# Patient Record
Sex: Female | Born: 1985 | Hispanic: Yes | Marital: Single | Smoking: Never smoker
Health system: Southern US, Community
[De-identification: ages and names within clinical notes are randomized; demographics above are authoritative.]

## PROBLEM LIST (undated history)

## (undated) ENCOUNTER — Inpatient Hospital Stay (HOSPITAL_COMMUNITY): Payer: Self-pay

## (undated) DIAGNOSIS — B999 Unspecified infectious disease: Secondary | ICD-10-CM

## (undated) HISTORY — PX: DILATION AND CURETTAGE OF UTERUS: SHX78

---

## 2013-10-08 ENCOUNTER — Ambulatory Visit: Payer: Self-pay | Admitting: Internal Medicine

## 2013-10-08 VITALS — BP 112/68 | HR 86 | Temp 98.1°F | Resp 16 | Ht 62.0 in | Wt 189.0 lb

## 2013-10-08 DIAGNOSIS — R21 Rash and other nonspecific skin eruption: Secondary | ICD-10-CM

## 2013-10-08 DIAGNOSIS — L42 Pityriasis rosea: Secondary | ICD-10-CM

## 2013-10-08 LAB — POCT CBC
Granulocyte percent: 63.4 %G (ref 37–80)
HCT, POC: 43.6 % (ref 37.7–47.9)
HEMOGLOBIN: 13.9 g/dL (ref 12.2–16.2)
Lymph, poc: 2 (ref 0.6–3.4)
MCH, POC: 29.6 pg (ref 27–31.2)
MCHC: 31.9 g/dL (ref 31.8–35.4)
MCV: 93 fL (ref 80–97)
MID (cbc): 0.4 (ref 0–0.9)
MPV: 9.5 fL (ref 0–99.8)
POC GRANULOCYTE: 4.1 (ref 2–6.9)
POC LYMPH PERCENT: 30.1 %L (ref 10–50)
POC MID %: 6.5 %M (ref 0–12)
Platelet Count, POC: 301 10*3/uL (ref 142–424)
RBC: 4.69 M/uL (ref 4.04–5.48)
RDW, POC: 13.9 %
WBC: 6.5 10*3/uL (ref 4.6–10.2)

## 2013-10-08 MED ORDER — CETIRIZINE HCL 10 MG PO TABS: 10.0000 mg | ORAL_TABLET | Freq: Every day | ORAL | Status: DC

## 2013-10-08 NOTE — Patient Instructions (Signed)

## 2013-10-08 NOTE — Progress Notes (Signed)
   Subjective:    Patient ID: Carla Sutton, female    DOB: 10/03/1985, 28 y.o.   MRN: [id number]  HPI Toniann FailWendy presents to the clinic today a rash all over. It started two days ago on her lower back. She states her palms became swollen at that time as well. She hasn't tried to treat is as of yet. She didn't eat any new foods or try any new detergents. She states the only thing she did different was color her hair. There is no rash in her scalp or on her face. She hasn't noticed any warmth to the rash. She notices that during bed time she itches. There is no one else in the home with a rash. She has recently gotten a new puppy in the past couple of weeks.   No fever, body aches, fatigue. No std exposure hx.  Review of Systems healthy    Objective:   Physical Exam  Vitals reviewed. Constitutional: She is oriented to person, place, and time. She appears well-developed and well-nourished. No distress.  HENT:  Head: Normocephalic.  Mouth/Throat: Oropharynx is clear and moist.  Eyes: EOM are normal. Pupils are equal, round, and reactive to light. No scleral icterus.  Neck: Normal range of motion. Neck supple.  Cardiovascular: Normal rate and normal heart sounds.   Pulmonary/Chest: Effort normal and breath sounds normal.  Abdominal: Soft.  Musculoskeletal: Normal range of motion.  Lymphadenopathy:    She has no cervical adenopathy.  Neurological: She is alert and oriented to person, place, and time. She exhibits normal muscle tone. Coordination normal.  Skin: Rash noted. No petechiae noted. Rash is maculopapular. Rash is not nodular and not vesicular. There is erythema.  Scattered trunk to thighs, faint erythema papules  Possible herald patch right lower trunk  Psychiatric: She has a normal mood and affect. Her behavior is normal.   No edema of palms or soles and no palm or sole rash  Results for orders placed in visit on 10/08/13  POCT CBC      Result Value Ref Range   WBC 6.5  4.6 - 10.2  K/uL   Lymph, poc 2.0  0.6 - 3.4   POC LYMPH PERCENT 30.1  10 - 50 %L   MID (cbc) 0.4  0 - 0.9   POC MID % 6.5  0 - 12 %M   POC Granulocyte 4.1  2 - 6.9   Granulocyte percent 63.4  37 - 80 %G   RBC 4.69  4.04 - 5.48 M/uL   Hemoglobin 13.9  12.2 - 16.2 g/dL   HCT, POC 40.943.6  81.137.7 - 47.9 %   MCV 93.0  80 - 97 fL   MCH, POC 29.6  27 - 31.2 pg   MCHC 31.9  31.8 - 35.4 g/dL   RDW, POC 91.413.9     Platelet Count, POC 301  142 - 424 K/uL   MPV 9.5  0 - 99.8 fL   rpr,hiv     Assessment & Plan:  Pityriasis Rosea likely Zyrtec 10mg  qd

## 2013-10-09 LAB — RPR

## 2013-10-09 LAB — HIV ANTIBODY (ROUTINE TESTING W REFLEX): HIV: NONREACTIVE

## 2013-10-10 ENCOUNTER — Encounter: Payer: Self-pay | Admitting: *Deleted

## 2013-12-24 ENCOUNTER — Encounter (HOSPITAL_COMMUNITY): Payer: Self-pay | Admitting: Emergency Medicine

## 2013-12-24 ENCOUNTER — Emergency Department (HOSPITAL_COMMUNITY)
Admission: EM | Admit: 2013-12-24 | Discharge: 2013-12-24 | Disposition: A | Discharge status: Home / Self Care | Payer: Medicaid Other | Attending: Emergency Medicine | Admitting: Emergency Medicine

## 2013-12-24 DIAGNOSIS — Z8759 Personal history of other complications of pregnancy, childbirth and the puerperium: Secondary | ICD-10-CM

## 2013-12-24 DIAGNOSIS — Z3202 Encounter for pregnancy test, result negative: Secondary | ICD-10-CM | POA: Insufficient documentation

## 2013-12-24 DIAGNOSIS — O09299 Supervision of pregnancy with other poor reproductive or obstetric history, unspecified trimester: Secondary | ICD-10-CM | POA: Insufficient documentation

## 2013-12-24 DIAGNOSIS — N926 Irregular menstruation, unspecified: Secondary | ICD-10-CM | POA: Insufficient documentation

## 2013-12-24 LAB — HCG, SERUM, QUALITATIVE: PREG SERUM: NEGATIVE

## 2013-12-24 NOTE — Discharge Instructions (Signed)
Do not hesitate to return to the Emergency Department for any new, worsening or concerning symptoms.   If you do not have a primary care doctor you can establish one at the   [facility]: [address] [place] [id number] [phone]  After you establish care. Let them know you were seen in the emergency room. They must obtain records for further management.    Pruebas de Psychiatristembarazo  (Pregnancy Tests) CMO FUNCIONAN LAS PRUEBAS DE [facility]?  Todas las pruebas de Psychiatristembarazo buscan una hormona especial en la orina o en la sangre que slo est presente en las mujeres embarazadas. Esta hormona, la gonadotropina corinica humana (hCG), tambin se conoce como la hormona del [place]embarazo.  CUL ES LA DIFERENCIA ENTRE UNA PRUEBA DE EMBARAZO DE ORINA Y UNA PRUEBA EMBARAZO EN SANGRE? ES UNA MEJOR QUE LA OTRA?  Hay dos tipos de pruebas de [place].   Pruebas de [place].  Pruebas de [place]. En ambas se busca la presencia de hCG, la hormona del embarazo. Muchas mujeres se hacen una prueba de [place] o una prueba casera de embarazo (HPT) para averiguar si estn embarazadas. Las HPT son econmicas, fciles de usar, se pueden hacer en casa y son privadas. Cuando una mujer tiene un resultado positivo en una HPT, debe ver a su mdico inmediatamente. El mdico confirmar el resultado positivo de la HPT con otro anlisis de [place], anlisis de [place]angre, [place]una ecografa y un examen plvico.  Hay dos tipos de anlisis de sangre que el mdico puede indicar.   Un anlisis de sangre cuantitativo (o prueba de hCG beta). Esta prueba mide la cantidad exacta de hCG en la sangre Esto significa que puede detectar cantidades muy pequeas de hCG, por lo que es una prueba muy precisa.  Anlisis de sangre hCG cualitativo. Esta prueba slo confirma o no el embarazo. Se asemeja ms a la prueba de [place] en cuanto a su exactitud. Los ARAMARK Corporationanlisis de sangre pueden Engineer, manufacturingdetectar la hCG antes que las pruebas de [place]. Los ARAMARK Corporationanlisis  de sangre pueden Engineer, manufacturingdetectar un Psychiatristembarazo de unos 6 a 8 das despus de liberar un vulo del ovario (ovular). Los anlisis de [place] pueden Landdetectar un embarazo alrededor de 2 semanas despus de la ovulacin. Algunas pruebas de orina ms sensibles pueden decir si usted est embarazada hasta 6 das o an hasta 1 da despus de la falta del perodo menstrual. CMO SE HACE UNA PRUEBA DE EMBARAZO EN CASA?  Hay muchos tipos de pruebas de embarazo caseras o HPT que se pueden comprar sin receta en las farmacias.   Algunas consisten en recolectar la orina en un recipiente y sumergir una tira reactiva en la orina y otros en poner un poco de orina en un recipiente especial con un gotero.  En otros casos se coloca una tira reactiva en el flujo de orina.  Las pruebas varan segn el tiempo de espera en que la tira reactiva o el contenedor tomen un cierto color o aparezca un smbolo en ella (como un smbolo ms o un smbolo menos).  Todos los envases vienen con instrucciones escritas. La mayora de los envases tambin traen nmeros de telfono gratuitos para Freight forwarderllamar en caso de duda sobre cmo hacer la prueba o leer los [place]per[place]resultados. QU PRECISIN TIENEN LAS PRUEBAS DE EMBARAZO CASERAS?  Las HPT son muy precisas. La mayora de las marcas de HPTs afirman que tienen entre el 97% y 99% de precisin cuando se realizan 1 semana despus de la falta del perodo menstrual,  pero esto puede variar con el uso real. Cada marca vara en el grado de sensibilidad para detectar la hormona del embarazo hCG. Si la prueba no se realiza correctamente, ser [employer]. Controle siempre el envase para asegurarse de que no ha pasado la fecha de vencimiento. En este caso no ser segura. La mayora de las marcas aconsejan volver a Radio producer la prueba despus de [address], no importa cules sean los [place]ese.  Si hace la prueba demasiado temprano en el embarazo, puede ocurrir que no haya suficiente cantidad de hormona del embarazo hCG en la  orina para obtener un resultado positivo. La mayora de HPTs sern precisos si realiza la prueba en la poca de la primera falta (aproximadamente 2 semanas despus de la ovulacin). Si no est embarazada, o si ha ovulado antes de lo que supona, obtendr un resultado negativo. Tambin puede haber problemas en el embarazo que afecten la cantidad de hCG que hay en la orina. Si el HPT es negativo, repita la prueba en unos das o en [place]. Si an el resultado es negativo y piensa que est embarazada, hable con su mdico inmediatamente acerca de cmo realizar una prueba de Retail banker.  PRUEBA DE EMBARAZO FALSO POSITIVA  Una HPT falso positiva ocurre cuando hay sangre o protenas en la orina. Puede haber falso positivo tambin si estuvo embarazada recientemente o si realiza la prueba de embarazo antes de [place], despus de haber utilizado un medicamento para la fertilidad que contenga hCG. Adems, algunos medicamentos recetados, como los diurticos, tranquilizantes, medicamentos psiquitricos, para las convulsiones, la [place], las nuseas (prometazina) dan falsos positivos.  PRUEBA DE EMBARAZO FALSO NEGATIVA   Una HPT falso negativa puede ocurrir si realiza la prueba [employer]. Trate de esperar hasta que tenga al menos 1 da de retraso de su periodo menstrual.  Tambin puede ocurrir si espera demasiado tiempo para utilizar la orina ( ms de 15 minutos).  Tambin si la orina est demasiado diluida por haber bebido mucho lquido antes de obtener la [place]. Lo mejor es [employer] la primera orina de la maana despus de levantarse de la cama. Si su periodo menstrual no se inici despus de una semana de un HPT negativa, repita la prueba de embarazo.  CUALQUIER CIRCUNSTANCIA PUEDE INTERFERIR CON LOS RESULTADOS DEL TEST DE EMBARAZO?  La [employer] de los medicamentos de venta libre como los recetados, incluyendo las pldoras anticonceptivas y los antibiticos, no Geologist, engineering los  resultados de [place] HPT. Slo los medicamentos que contienen hormona del embarazo hCG pueden dar un resultado falso positivo. Los medicamentos que contienen hCG se pueden usar para el tratamiento de la infertilidad (imposibilidad para quedar embarazada). El alcohol y las drogas no afectan los Post del HPT, pero no debera usar estas sustancias si est tratando de quedar embarazada. Si usted tiene una prueba de [place] positiva, llame a su mdico para hacer una cita y comenzar el cuidado prenatal. Document Released: 06/20/2012 [facility] Patient Information 2014 [place], [place].

## 2013-12-24 NOTE — ED Notes (Signed)
Pt in requesting a blood draw for a pregnancy test, states she took some tests at home and some were positive and some were negative so she wanted to be sure. Denies complaints or pain.

## 2013-12-24 NOTE — ED Notes (Signed)
Declined W/C at D/C and was escorted to lobby by RN. 

## 2013-12-24 NOTE — ED Provider Notes (Signed)
CSN: [id number]     Arrival date & time 12/24/13  1648 History   None   This chart was scribed for non-physician practitioner, Wynetta EmeryNicole Esmond Hinch  PA-C working with Audree CamelScott T Goldston, MD by Arlan OrganAshley Leger, ED Scribe. This patient was seen in room TR07C/TR07C and the patient's care was started at 7:12 PM.   Chief Complaint  Patient presents with  . Possible Pregnancy  \  The history is provided by the patient. No language interpreter was used.    HPI Comments: Carla Sutton is a 28 y.o. female who presents to the Emergency Department for possible pregnancy today. Pt states she took a few at home pregnancy tests with both negative and positive results. She states she has a history of miscarriages and wanted to take extra precaution today. LNMP 4/30. She denies any nausea, vomiting, diarrhea, or abdominal pain. She is currently not followed by an OB/GYN. She has no other pertinent past medical history. No other concerns this visit.  History reviewed. No pertinent past medical history. History reviewed. No pertinent past surgical history. Family History  Problem Relation Age of Onset  . Cancer Mother   . Diabetes Mother   . Heart disease Mother   . Hyperlipidemia Mother   . Hypertension Mother   . Hyperlipidemia Father   . Hypertension Father   . Diabetes Father    History  Substance Use Topics  . Smoking status: Never Smoker   . Smokeless tobacco: Not on file  . Alcohol Use: 2.5 oz/week    5 drink(s) per week   OB History   Grav Para Term Preterm Abortions TAB SAB Ect Mult Living                 Review of Systems  Constitutional: Negative for fever and chills.  HENT: Negative for congestion.   Eyes: Negative for redness.  Respiratory: Negative for cough.   Gastrointestinal: Negative for nausea, vomiting and abdominal pain.  Skin: Negative for rash.  Psychiatric/Behavioral: Negative for confusion.      Allergies  Review of patient's allergies indicates no known  allergies.  Home Medications   Prior to Admission medications   Not on File   Triage Vitals: BP 126/90  Pulse 107  Temp(Src) 98.5 F (36.9 C) (Oral)  SpO2 100%  LMP 11/14/2013   Physical Exam  Nursing note and vitals reviewed. Constitutional: She is oriented to person, place, and time. She appears well-developed and well-nourished. No distress.  HENT:  Head: Normocephalic.  Eyes: Conjunctivae and EOM are normal.  Cardiovascular: Normal rate.   Pulmonary/Chest: Effort normal. No stridor.  Musculoskeletal: Normal range of motion.  Neurological: She is alert and oriented to person, place, and time.  Psychiatric: She has a normal mood and affect.    ED Course  Procedures (including critical care time)  DIAGNOSTIC STUDIES: Oxygen Saturation is 100% on RA, Normal by my interpretation.    COORDINATION OF CARE: 7:14 PM- Advised pt to establish with an OB/GYN. Discussed treatment plan with pt at bedside and pt agreed to plan.     Labs Review Labs Reviewed  HCG, SERUM, QUALITATIVE    Imaging Review No results found.   EKG Interpretation None      MDM   Final diagnoses:  Negative pregnancy test  Irregular menstrual bleeding  History of miscarriage, not currently pregnant    Filed Vitals:   12/24/13 1703 12/24/13 1918  BP: 126/90 102/73  Pulse: 107 90  Temp: 98.5 F (36.9 C) 98.5  F (36.9 C)  TempSrc: Oral Oral  Resp:  18  SpO2: 100% 98%    Medications - No data to display  Carla Sutton is a 28 y.o. female presenting with possible pregnancy, serum pregnancy test is negative. Advised to follow with OB/GYN at [facility].  Evaluation does not show pathology that would require ongoing emergent intervention or inpatient treatment. Pt is hemodynamically stable and mentating appropriately. Discussed findings and plan with patient/guardian, who agrees with care plan. All questions answered. Return precautions discussed and outpatient follow up given.    I  personally performed the services described in this documentation, which was scribed in my presence. The recorded information has been reviewed and is accurate.    Wynetta Emery, PA-C 12/24/13 2035

## 2013-12-25 NOTE — ED Provider Notes (Signed)
Medical screening examination/treatment/procedure(s) were performed by non-physician practitioner and as supervising physician I was immediately available for consultation/collaboration.   EKG Interpretation None        Josie Mesa T Admiral Marcucci, MD 12/25/13 1640 

## 2014-01-09 ENCOUNTER — Encounter (HOSPITAL_COMMUNITY): Payer: Self-pay | Admitting: Emergency Medicine

## 2014-01-09 ENCOUNTER — Emergency Department (HOSPITAL_COMMUNITY)
Admission: EM | Admit: 2014-01-09 | Discharge: 2014-01-10 | Disposition: A | Discharge status: Home / Self Care | Payer: Medicaid Other | Attending: Emergency Medicine | Admitting: Emergency Medicine

## 2014-01-09 DIAGNOSIS — Z3490 Encounter for supervision of normal pregnancy, unspecified, unspecified trimester: Secondary | ICD-10-CM

## 2014-01-09 DIAGNOSIS — N760 Acute vaginitis: Secondary | ICD-10-CM | POA: Insufficient documentation

## 2014-01-09 DIAGNOSIS — O2390 Unspecified genitourinary tract infection in pregnancy, unspecified trimester: Secondary | ICD-10-CM | POA: Insufficient documentation

## 2014-01-09 DIAGNOSIS — B9689 Other specified bacterial agents as the cause of diseases classified elsewhere: Secondary | ICD-10-CM | POA: Insufficient documentation

## 2014-01-09 DIAGNOSIS — A499 Bacterial infection, unspecified: Secondary | ICD-10-CM | POA: Insufficient documentation

## 2014-01-09 LAB — CBC
HCT: 39.2 % (ref 36.0–46.0)
HEMOGLOBIN: 13.2 g/dL (ref 12.0–15.0)
MCH: 29.9 pg (ref 26.0–34.0)
MCHC: 33.7 g/dL (ref 30.0–36.0)
MCV: 88.9 fL (ref 78.0–100.0)
Platelets: 277 10*3/uL (ref 150–400)
RBC: 4.41 MIL/uL (ref 3.87–5.11)
RDW: 12.8 % (ref 11.5–15.5)
WBC: 10 10*3/uL (ref 4.0–10.5)

## 2014-01-09 LAB — PREGNANCY, URINE: Preg Test, Ur: POSITIVE — AB

## 2014-01-09 LAB — HCG, QUANTITATIVE, PREGNANCY: hCG, Beta Chain, Quant, S: 3217 m[IU]/mL — ABNORMAL HIGH (ref <5)

## 2014-01-09 NOTE — ED Notes (Signed)
Patient states that she is cramping and her back is hurting

## 2014-01-09 NOTE — ED Provider Notes (Signed)
CSN: [id number]     Arrival date & time 01/09/14  1952 History   First MD Initiated Contact with Patient 01/09/14 2301     Chief Complaint  Patient presents with  . Possible Pregnancy     (Consider location/radiation/quality/duration/timing/severity/associated sxs/prior Treatment) HPI 28 yo female presents to the ER from home with complaint of possible pregnancy.  Pt was seen in the ED on 6/9 after equivocal home pregnancy test, and had negative serum pregnancy test.  Since that time she has had increased breast tenderness, back pain.  She was told by planned pregnancy that she had a positive pregnancy test.  Last normal menstrual cycle was 4/30, but had one day of bleeding around 5/25.  Pt is sexually active, no birth control used.  Pt is not taking prenatal vitamins.  Pt reports h/o miscarrage at around 6-9 weeks requiring d&c.  Pt is very concerned she may have another miscarriage and wishes to find out ways to prevent this from happening.  She denies any vaginal bleeding, no pelvic pain.   History reviewed. No pertinent past medical history. Past Surgical History  Procedure Laterality Date  . Dilation and curettage of uterus     Family History  Problem Relation Age of Onset  . Cancer Mother   . Diabetes Mother   . Heart disease Mother   . Hyperlipidemia Mother   . Hypertension Mother   . Hyperlipidemia Father   . Hypertension Father   . Diabetes Father    History  Substance Use Topics  . Smoking status: Never Smoker   . Smokeless tobacco: Never Used  . Alcohol Use: 2.5 oz/week    5 drink(s) per week   OB History   Grav Para Term Preterm Abortions TAB SAB Ect Mult Living                 Review of Systems  All other systems reviewed and are negative.     Allergies  Review of patient's allergies indicates no known allergies.  Home Medications   Prior to Admission medications   Not on File   BP 143/85  Pulse 96  Temp(Src) 98.1 F (36.7 C) (Oral)  Resp 18  Ht  5\' 1"  (1.549 m)  Wt 194 lb 9.6 oz (88.27 kg)  BMI 36.79 kg/m2  SpO2 100%  LMP 11/14/2013 Physical Exam  Nursing note and vitals reviewed. Constitutional: She is oriented to person, place, and time. She appears well-developed and well-nourished. She appears distressed (tearful).  HENT:  Head: Normocephalic and atraumatic.  Nose: Nose normal.  Mouth/Throat: Oropharynx is clear and moist.  Eyes: Conjunctivae and EOM are normal. Pupils are equal, round, and reactive to light.  Neck: Normal range of motion. Neck supple. No JVD present. No tracheal deviation present. No thyromegaly present.  Cardiovascular: Normal rate, regular rhythm, normal heart sounds and intact distal pulses.  Exam reveals no gallop and no friction rub.   No murmur heard. Pulmonary/Chest: Effort normal and breath sounds normal. No stridor. No respiratory distress. She has no wheezes. She has no rales. She exhibits no tenderness.  Abdominal: Soft. Bowel sounds are normal. She exhibits no distension and no mass. There is no tenderness. There is no rebound and no guarding.  Genitourinary:  External genitalia normal Vagina with mild white discharge Cervix closed no lesions No cervical motion tenderness Adnexa palpated, no masses or tenderness noted Bladder palpated no tenderness Uterus palpated no masses or tenderness    Musculoskeletal: Normal range of motion. She exhibits  no edema and no tenderness.  Lymphadenopathy:    She has no cervical adenopathy.  Neurological: She is alert and oriented to person, place, and time. She exhibits normal muscle tone. Coordination normal.  Skin: Skin is warm and dry. No rash noted. No erythema. No pallor.  Psychiatric: She has a normal mood and affect. Her behavior is normal. Judgment and thought content normal.    ED Course  Procedures (including critical care time) Labs Review Labs Reviewed  WET PREP, GENITAL - Abnormal; Notable for the following:    Clue Cells Wet Prep HPF  POC MODERATE (*)    WBC, Wet Prep HPF POC MANY (*)    All other components within normal limits  HCG, QUANTITATIVE, PREGNANCY - Abnormal; Notable for the following:    hCG, Beta Chain, Quant, S 3217 (*)    All other components within normal limits  PREGNANCY, URINE - Abnormal; Notable for the following:    Preg Test, Ur POSITIVE (*)    All other components within normal limits  GC/CHLAMYDIA PROBE AMP  CBC  URINALYSIS, ROUTINE W REFLEX MICROSCOPIC    Imaging Review No results found.   EKG Interpretation None      MDM   Final diagnoses:  Pregnancy  Bacterial vaginosis    28 yo G2P1 with elevated hcg, estimated 3-5 week pregnancy.  Pt counseled on keeping her ob appt tomorrow, starting prenatal vitamins, getting book on pregnancy, and staying healthy.  Do not feel pt needs u/s at this time, given minimal back pain, and very early pregnancy.  Mild BV noted, will start on flagy   Olivia Mackielga M Otter, MD 01/10/14 1655

## 2014-01-09 NOTE — ED Notes (Signed)
Patient presents tearful and is not feeling right.  States she has been told she is not pregnant then planned partenthood tells her she is pregnant.  Has had a miscarriage in the past and wants to make sure she is not going to have that same problem

## 2014-01-10 LAB — URINALYSIS, ROUTINE W REFLEX MICROSCOPIC
Bilirubin Urine: NEGATIVE
Glucose, UA: NEGATIVE mg/dL
HGB URINE DIPSTICK: NEGATIVE
Ketones, ur: NEGATIVE mg/dL
Leukocytes, UA: NEGATIVE
Nitrite: NEGATIVE
Protein, ur: NEGATIVE mg/dL
SPECIFIC GRAVITY, URINE: 1.025 (ref 1.005–1.030)
Urobilinogen, UA: 0.2 mg/dL (ref 0.0–1.0)
pH: 5.5 (ref 5.0–8.0)

## 2014-01-10 LAB — WET PREP, GENITAL
Trich, Wet Prep: NONE SEEN
YEAST WET PREP: NONE SEEN

## 2014-01-10 LAB — GC/CHLAMYDIA PROBE AMP
CT Probe RNA: POSITIVE — AB
GC Probe RNA: NEGATIVE

## 2014-01-10 MED ORDER — METRONIDAZOLE 250 MG PO TABS: 250.0000 mg | ORAL_TABLET | Freq: Three times a day (TID) | ORAL | Status: DC

## 2014-01-10 MED ORDER — PRENATAL COMPLETE 14-0.4 MG PO TABS: 1.0000 | ORAL_TABLET | Freq: Every day | ORAL | Status: DC

## 2014-01-10 NOTE — Discharge Instructions (Signed)
Vaginosis bacteriana (Bacterial Vaginosis) La vaginosis bacteriana es una infeccin vaginal que perturba el equilibrio normal de las bacterias que se encuentran en la vagina. Es el resultado de un crecimiento excesivo de ciertas bacterias. Esta es la infeccin vaginal ms frecuente en mujeres en edad reproductiva. El tratamiento es importante para prevenir complicaciones, especialmente en mujeres embarazadas, dado que puede causar un parto prematuro. CAUSAS  La vaginosis bacteriana se origina por un aumento de bacterias nocivas que, generalmente, estn presentes en cantidades ms pequeas en la vagina. Varios tipos diferentes de bacterias pueden causar esta afeccin. Sin embargo, la causa de su desarrollo no se comprende totalmente. FACTORES DE RIESGO Ciertas actividades o comportamientos pueden exponerlo a un mayor riesgo de desarrollar vaginosis bacteriana, entre los que se incluyen:  Tener una nueva pareja sexual o mltiples parejas sexuales.  Las duchas vaginales  El uso del DIU (dispositivo intrauterino) como mtodo anticonceptivo. El contagio no se produce en baos, por ropas de cama, en piscinas o por contacto con objetos. SIGNOS Y SNTOMAS  Algunas mujeres que padecen vaginosis bacteriana no presentan signos ni sntomas. Los sntomas ms comunes son:  Secrecin vaginal de color grisceo.  Secrecin vaginal con olor similar al [employer], especialmente despus de Sales promotion account executive.  Picazn o sensacin de ardor en la vagina o la vulva.  Ardor o dolor al [employer]ocoPhillips. DIAGNSTICO  Su mdico analizar su historia clnica y le examinar la vagina para detectar signos de vaginosis bacteriana. Puede tomarle Lauris Poag de flujo vaginal. Su mdico examinar esta muestra con un microscopio para controlar las bacterias y clulas anormales. Tambin puede realizarse un anlisis del pH vaginal.  TRATAMIENTO  La vaginosis bacteriana puede tratarse con antibiticos, en forma de comprimidos o  de crema vaginal. Puede indicarse una segunda tanda de antibiticos si la afeccin se repite despus del tratamiento.  INSTRUCCIONES PARA EL CUIDADO EN EL HOGAR   Tome solo medicamentos de venta libre o recetados, segn las indicaciones del mdico.  Si le han recetado antibiticos, tmelos como se le indic. Asegrese de que finaliza la prescripcin completa aunque se sienta mejor.  No mantenga relaciones sexuales Librarian, academic.  Comunique a sus compaeros sexuales que sufre una infeccin vaginal. Deben consultar a su mdico y recibir tratamiento si tienen problemas, como picazn o una erupcin cutnea leve.  Practique el sexo seguro usando preservativos y tenga un nico compaero sexual. SOLICITE ATENCIN MDICA SI:   Sus sntomas no mejoran despus de 3 das de [place].  Aumenta la secrecin o Chief Technology Officer.  Tiene fiebre. ASEGRESE DE QUE:   Comprende estas instrucciones.  Controlar su afeccin.  Recibir ayuda de inmediato si no mejora o si empeora. PARA OBTENER MS INFORMACIN  Centros para el control y la prevencin de Child psychotherapist for Disease Control and Prevention, CDC): SolutionApps.co.za Asociacin Estadounidense de la Salud Sexual (American Sexual Health Association, SHA): www.ashastd.org  Document Released: 10/11/2007 Document Revised: 04/24/2013 [facility] Patient Information 2015 [place], [place]. This information is not intended to replace advice given to you by your health care provider. Make sure you discuss any questions you have with your health care provider.  Defectos del nacimiento, prevencin (Birth Defects, Prevention) Un defecto congnito es una anormalidad que el beb presenta en el momento de nacer. Slo entre el 2% y el 3% de los bebs puede tener un defecto congnito grave. Algunos pueden tratarse con medicamentos. Otros pueden tratarse con [place]. Muchos de [employer] evitarse, otros no. La muerte por defectos de  nacimiento no es comn.  Algunos pueden hallarse realizando estudios durante el [place]. Sin embargo, no son 100% certeros:  Physiological scientist prueba puede [employer] positivos para un defecto cuando el nio es normal (falso positivo).  Una prueba puede mostrar que no hay problemas cuando efectivamente hay un defecto congnito (falso negativo). Algunas de estas pruebas son:  Regulatory affairs officer.  Anlisis de Energy East Corporation.  Muestras de vellosidades corinicas.  Pruebas del lquido amnitico (amniocentesis).  Anlisis de sangre tomada del cordn umbilical del feto (cordocentesis). CAUSAS La mayora de los defectos de nacimiento tienen causas desconocidas. Sin embargo, algunos defectos se deben a causas especficas, como por ejemplo:  Los defectos genticos pueden ser originados en genes y cromosomas anormales. Ellos pueden ser:  Fibrosis qustica.  Anemia drepanoctica.  Enfermedad de Tay-Sachs.  Sndrome del x frgil.  Sndrome de Down.  Talasemia.  Enfermedades infecciosas como:  [place] measles.  Cytomegalovirus.  Toxoplasmosis.  Parovirus.  Varicela  La sfilis.  Virus del Herpes.  Diabetes no controlada antes y Academic librarian.  Convulsiones recurrentes (epilepsia).  Agentes qumicos como:  Consumo de alcohol, que puede producir discapacidad fsica y Mudlogger en el beb. Este trastorno se denomina sndrome de alcoholismo fetal (SAF).  El [place], que se encuentra en ciertos peces (tiburn, pez espada, azulejo).  Plomo, generalmente hallado en las pinturas.  El consumo de drogas.  Humo del cigarrillo.  Ciertos medicamentos prescriptos, de venta libre y las hierbas medicinales que toma. Si le han prescripto un medicamento, converse con su mdico antes de interrumpirlo.  Radiacin.  Quimioterapia.  Carencia de cido flico, una vitamin B, antes y [address]  Falta de yodo antes y Academic librarian. FACTORES DE RIESGO   Edad  de la madre de ms de 35 aos.  Edad del padre de ms de 50 aos.  Madre con historia de diabetes.  Madre con historia de epilepsia.  Historial de defectos de nacimiento en sus padres.  Otro beb con defectos de nacimiento.  Un historial familiar de defectos de nacimiento. INSTRUCCIONES PARA EL CUIDADO Y PREVENCIN DOMICILIARIA:  Consulte al mdico tan pronto como crea que est embarazada.  No beba alcohol antes o durante el embarazo.  No consuma drogas.  No fume antes (siempre que sea posible) y Academic librarian.  Antes de quedar embarazada, tome un multavitamnico, segn las instrucciones del mdico.  Tomar cido flico 0.4 mg antes y Academic librarian. Ayuda a prevenir la espina bfida y un trastorno en el que el cerebro no se desarrolla (anencefalia). Las multivitaminas suelen contener cido flico. Tambin puede hallarse en la mayora de los cereales de desayuno.  Siga las indicaciones del profesional que lo asiste acerca del consumo de yodo. El mdico podr sugerirle que tome 150 microgramos de yodo antes y Academic librarian. Las multivitaminas suelen contener yodo.  No tome ms de 5 000 unidades internacionales de vitamina A por da.  Consuma una dieta balanceada y nutritiva.  Evite el consumo de pescado que contenga mercurio (tiburn, pez espada, [place]).  Evite consumir quesos suaves antes y Academic librarian.  Consulte a su mdico antes de tomar cualquier medicamento prescripto, de venta libre o hierbas.  Si hay una historia de defectos congnitos en la madre, el padre, nios que han nacido anteriormente o miembros de la familia, podr recibir asesoramiento gentico antes de quedar embarazada, si lo desea.  Si es diabtica, asegrese que su enfermedad est controlada antes y Academic librarian.  Reciba inmunizaciones  antes de [employer]. Deber recibir las vacunas contra la rubola, las paperas, el sarampin, la varicela, la hepatitis y [place]. Las vacunas que se reciben durante el embarazo pueden daar al beb.  Si va a tener un beb con un defecto de nacimiento que necesita tratamiento, trate de dar a Financial planner hospital. Dispondrn de los medios, los mdicos, las instalaciones de cuidados intensivos y los equipos necesarios para tratar y asistir los problemas del beb. EST SEGURO QUE:   Comprende las instrucciones para el alta mdica.  Controlar su enfermedad.  Solicitar atencin mdica de inmediato segn las indicaciones. Document Released: 12/21/2007 Document Revised: 09/26/2011 [facility] Patient Information 2015 [place], [place]. This information is not intended to replace advice given to you by your health care provider. Make sure you discuss any questions you have with your health care provider.  cido flico en el embarazo (Folic Acid in Pregnancy) El cido flico es una vitamina B que ayuda a prevenir la anomala congnita del tubo neural. El tubo neural es la parte de un feto en desarrollo que se convierte en el cerebro y la mdula espinal. Cuando el tubo neural no se cierra como corresponde, el beb nace con un defecto del tubo neural. Los defectos del tubo neural incluyen espina bfida, hernia de la mdula espinal y la ausencia de una parte o de todo el cerebro (anencefalia).  Tome cido flico al menos 4semanas antes de quedar embarazada y Energy Transfer Partners primeros de [place], que es cuando se desarrolla el tubo neural. El cido flico est disponible en la mayora de las multivitaminas o como suplemento con cido flico solamente, y tambin lo contienen algunos alimentos. El consumo de la cantidad [place] de cido flico antes de la concepcin y durante el embarazo disminuye las probabilidades de Warehouse manager un beb con un defecto del tubo neural. El hecho de tomar cido flico no influir en un defecto del tubo neural en el caso de que ya se haya desarrollado. DIAGNSTICO   Si el feto tiene un defecto del  tubo neural, un anlisis de alfa fetoprotena (AFP) en la sangre o de lquido amnitico mostrar niveles altos de alfa fetoprotena. Este anlisis se hace a todas las Tax inspector.  Una ecografa puede detectar un defecto del tubo neural. LO QUE USTED PUEDE HACER:  Tome una multivitamina con al Lowe's Companies 0,31miligramos ( ) de cido flico [employer], al menos 4semanas antes de quedar embarazada y [place] las primeras 12semanas de Psychiatrist.  Si ya ha tenido un beb con un defecto del tubo neural, tome ( ) de cido flico [employer]. Tome esta cantidad desde antes de intentar quedar [place] y [address] los primeros de [place]. Si tiene trastorno convulsivo o toma medicamentos para controlar las convulsiones, infrmeselo a su obstetra. Siga tomando el cido flico, excepto si le indican lo contrario.  CIDO FLICO EN LOS ALIMENTOS Siga una dieta saludable con alimentos que contengan cido flico, la forma natural de la vitamina. Algunos de estos alimentos son:  Cereales fortificados para el desayuno.  Lentejas.  Esprragos.  [place].  Vsceras (hgado).  Frijoles negros.  Man (solo si no es Counselling psychologist).  Brcoli.  Aura Camps.  Jugo de naranja (es mejor el jugo concentrado).  Pastas y panes enriquecidos.  Cathleen Fears. CONSULTE A SU MDICO EN LOS SIGUIENTES CASOS:  Est en el primer trimestre de embarazo y tiene niveles altos de azcar en la sangre.  Est en el primer  trimestre de embarazo y tiene fiebre alta. En casi todos los casos, un feto en el que se detecta un defecto del tubo neural necesitar atencin especializada, que puede no estar disponible en todos los hospitales. Hable con su mdico sobre lo que es mejor para usted y su beb. Document Released: 04/24/2013 Document Revised: 07/09/2013 [facility] Patient Information 2015 [place], [place]. This information is not  intended to replace advice given to you by your health care provider. Make sure you discuss any questions you have with your health care provider.  Embarazo, Engineer, maintenance trimestre (Pregnancy, First Trimester) El primer trimestre son los primeros tres meses en los que el beb crece dentro suyo. Es importante seguir las indicaciones del profesional que lo asiste. CUIDADOS EN EL HOGAR  No fumar.  No beba alcohol.  Tomar slo los medicamentos que el mdico le haya indicado.  Ejercicios.  Consuma una dieta saludable. Consuma alimentos balanceados.  Puede tener relaciones sexuales si no hay problemas con el embarazo.  Ayuda para los nuseas matinales:  Coma galletitas saladas antes de levantarse por la [place].  Coma 4  5 comidas pequeas por da en vez de tres grandes.  Beba lquidos entre comidas, no durante ellas.  Concurra a la Training and development officer profesional que lo asiste de acuerdo a lo que le haya indicado.  A veces necesita vitaminas extra y hierro Academic librarian. Pregunte al mdico si las necesita. SOLICITE AYUDA DE INMEDIATO SI:  La temperatura oral.  Percibe que tiene una prdida con olor ftido que proviene de la vagina.  Observa una hemorragia que proviene de la vagina.  Siente dolor intenso en el vientre o la espalda.  Vomita sangre. Vmitos que parecen borra de caf.  Pierde ms de 1 Kg en una semana.  Gana ms de 2 Kg en una semana.  Aumenta ms de 1 Kg en una semana Y nota los tobillos, los pies o las piernas hinchados.  Siente mareos o se desmaya.  Ha estado cerca de personas con rubola. , varicela, o la quinta enfermedad.  Presenta dolor de cabeza, diarrea, dolor al orinar o le falta la respiracin. Document Released: 09/30/2008 Document Revised: 09/26/2011 [facility] Patient Information 2015 [place], [place]. This information is not intended to replace advice given to you by your health care provider. Make sure you discuss any questions you have with your  health care provider.  Cmo crece un beb durante el embarazo (How a Baby Grows During Pregnancy) Un embarazo comienza cuando el espermatozoide del hombre ingresa al vulo de la [place]. Esto ocurre en la trompa de [place] y se denomina fertilizacin. El vulo fertilizado se llama embrin, hasta que llega a las nueve semanas a partir del momento de Tree surgeon. De las nueve semanas hasta el nacimiento se denomina "feto". El vulo fertilizado baja por la trompa [place] tero y se adhiere al recubrimiento interior del tero.  La mujer embarazada es responsable del crecimiento del embrin/feto al proveer alimento y oxgeno a travs de la corriente sangunea y la placenta hacia el feto en desarrollo. El tero se [place] y crece hacia fuera del abdomen ms y ms a medida que el feto se desarrolla y crece. Un embarazo normal dura [address] con un rango de [address], o 40 semanas. El [place] se divide en tres trimestres.  Primer trimestre - Semanas 0 a 13.  Segundo trimestre - Semana 14 a 27.  Tercer trimestre - Semana 28 a 40. El da que se considera que va  a nacer su beb se denomina fecha estimada de parto (FEP). CRECIMIENTO DEL BEB MES A MES 1. Primer mes: El vulo fertilizado se adhiere a Hydrographic surveyor interior del tero, y determinadas clulas formaran la placenta y Firefighter el feto. Comienzan a [employer] Products, las piernas, el cerebro, la mdula espinal, los pulmones, y Insurance underwriter. Hacia el final del primer mes el corazn comienza a Physicist, medical. El embrin pesa menos de 1 onza (28,35 gramos) y mide  pulgada (2,54 cm). 2. Gwynneth Aliment mes: Se pueden observar los huesos, se forman el odo interno, los prpados, las manos y los pies y se Photographer. Hacia el fin de la [place], ya se estn desarrollando todos los rganos ms importantes. El feto ahora pesa menos de 1 onza [phone] gramos) y Anice Paganini pulgada (2,54 cm). 3. Tercer mes: Aparecen brotes de los dientes,  todos los rganos internos se forman, los huesos y los msculos comienzan a Designer, industrial/product, la espina dorsal puede flexionarse y la piel es transparente. Comienzan a formarse las uas de las manos y los pies, las manos se desarrollan ms rpido [employer] Corporation 10 y los brazos son ms largos que las piernas en este punto. El feto ahora pesa un poco ms que 1 onza (.75kg) y mide 3 pulgadas (8,89cm). 4. Cuarto mes: La placenta se ha formado por completo. Se han formado los rganos sexuales externos, el cuello, el odo externo, las cejas, los prpados, y las uas. El feto ya puede or, tragar, flexionar sus brazos y sus piernas, y el rin comienza a producir orina. La piel tiene un recubrimiento blanco ceroso (vernix) y un bello muy fino (lanugo). El feto ahora pesa 5 onzas (0,14kg) y mide de 6 a 7 pulgadas (16,51 cm). 5. Quinto mes: El feto se mueve ms y puede sentirse por primera vez (se denomina avivamiento), duerme y se despierta en ocasiones, puede comenzar a succionarse el dedo y las uas crecen [employer] General final del dedo. La vescula biliar ahora funciona y [place] a digerir los nutrientes, los ovarios se desarrollan en la mujer y los testculos comienzan a Publishing copy del abdomen hacia el escroto en el hombre. El feto ahora pesa  a 1 libra [phone]) y mide de 6 a 7 pulgadas (25,4cm). 6. Sexto mes: Se forman los pulmones pero el feto an no respira. Los ojos se abren, el cerebro se desarrolla ahora ms rpidamente, se pueden detectar las huellas dactilares de las manos y los pies y crece bello ms grueso. El feto ahora pesa 1 a 1 libras [phone]) y mide 12 pulgadas (30,48 cm). 7. Sptimo mes: El feto puede oir y responder a los sonidos, patea y se estira y puede percibir cambios en la luz. El feto ahora pesa 2 a 2 libras (1,13kg) y mide 14 pulgadas (35,56 cm). 8. Octavo mes: Todos los rganos y sistemas corporales estn desarrollados por completo y en funcionamiento. Los huesos se vuelven ms duros, se desarrollan las papilas  gustativas con las que puede percibir gustos dulces y [place], y el feto puede tener hipo. Las diferentes partes del cerebro se desarrollan y el crneo se mantiene blando para que crezca el cerebro. El feto ahora pesa 5 libras (2,27kg) y mide 36 pulgadas (45,75cm). 9. Noveno mes: El feto aumenta alrededor de media libra por semana, los pulmones estn completamente desarrollados, se desarrollan patrones de sueo y la cabeza se mueve hacia el fondo del tero, denominado vertex. Si en su lugar las nalgas se mueven hacia el fondo  del tero, se denomina "presentacin de nalgas". El feto ahora pesa 6 a 9 libras (2,72kg a 102,08kg) y mide 20 pulgadas (50,8 cm). Deber informarse lo ms posible acerca de su embarazo, de usted y de cmo se desarrolla el beb. Estar informada la ayuda a Freight forwarder experiencia. Tambin Radiographer, therapeutic la capacidad de sentir si algo no est bien y cundo Civil Service fast streamer. Consulte con el profesional que la asiste si tiene dudas acerca su beb o de su propio cuerpo. Document Released: 12/21/2007 Document Revised: 09/26/2011 [facility] Patient Information 2015 [place], [place]. This information is not intended to replace advice given to you by your health care provider. Make sure you discuss any questions you have with your health care provider.  Medicamentos durante el embarazo  (Medicines During Pregnancy) Durante el embarazo, hay medicamentos que son seguros y otros no lo son. Entre los United Parcel se Baxter International recetados, los de [place], las cremas tpicas que se aplican en la piel y todos los medicamentos de hierbas. Los medicamentos se clasifican en clase A, B, C, o D. Los medicamentos de clase A y B han demostrado ser seguros en el Psychiatrist. Los medicamentos de clase C tambin se consideran seguros durante el [place], pero slo deben usarse cuando son necesarios. Los medicamentos clase D no deben Animator. Pueden ser dainos para un beb.  Lo mejor es  Occupational hygienist cantidad posible de medicamentos durante el [place]. Sin embargo, algunos son necesarios para cuidar la salud del beb y Chief Strategy Officer. A veces, es ms peligroso dejar de tomar ciertos medicamentos que continuar usndolos. Este es el caso de las personas que sufren enfermedades de larga duracin (crnicas) como el asma, la diabetes o la presin arterial alta (hipertensin). Si est embarazada y sufre una enfermedad crnica, llame a su mdico de inmediato. Lleve una lista de los medicamentos que [place] y sus dosis a las visitas mdicas. Si usted est planeando quedar embarazada, programe una cita con el mdico e infrmele acerca de los medicamentos que toma.  Por ltimo, anote el nmero de telfono de Film/video editor. Podr responder preguntas sobre la clase a que pertenece un medicamento y su seguridad. No podr darle asesoramiento en cuanto a si debe o no debe tomar un medicamento.  MEDICAMENTOS SEGUROS Y NO SEGUROS  Hay una larga lista de medicamentos que se consideran seguros para Engineer, site. A continuacin se [place]. Para determinados medicamentos, consulte con su mdico.  Medicamentos para laalergia La loratadina, cetirizina y clorfeniramina son medicamentos seguros. Algunos aerosoles nasales que contienen corticoides son seguros. Consulte a su mdico acerca de las marcas especficas que son seguras.  Analgsicos El paracetamol y el paracetamol con codena son seguros. Todos los otros medicamentos anti-inflamatorios no esteroideos (AINE) no son seguros. Aqu se incluye el ibuprofeno.  Anticidos  Muchos anticidos de venta libre son seguros para tomar. Consulte a su mdico acerca de las marcas especficas que son seguras. La famotidina, ranitidina, lansoprazol estn permitidos. El omeprazol se considera seguro para tomar en el segundo trimestre.  Antibiticos  Hay varios antibiticos que Energy manager. Estos incluyen tetraciclina, quinolonas y  sulfas, pero puede haber otros. Consulte a su mdico antes de tomar cualquier antibitico.  Antihistamnicos  Consulte a su mdico acerca de las marcas especficas que son seguras.  Medicamentos para el asma  La mayora de los inhaladores con corticoides para el asma son seguros. Hable con su mdico para obtener informacin especfica.  Calcio  Los suplementos de calcio son seguros. No consuma calcio de ostras.  Medicamentos para la tos y el resfro  Es seguro que utilice productos que contienen guaifenesina o dextrometorfano. Consulte a su mdico acerca de las marcas especficas que son seguras. No es seguro tomar productos que contengan aspirina o ibuprofeno.  Medicamentos descongestivos Los productos que contienen pseudoefedrina son seguros para tomar en el segundo y Systems analyst trimestre.  Antidepresivos  Consulte a su mdico acerca de estos medicamentos.  Antidiarreicos  Es seguro tomar loperamida. Consulte a su mdico acerca de las marcas especficas que son seguras. No es seguro tomar medicamentos antidiarreicos que contengan bismuto.  Gotas oftlmicas  Las gotas oftlmicas para la alergia deben limitarse.  Hierro  Es seguro usar en el embarazo ciertos medicamentos que contienen hierro para la anemia. Ellos requieren Auto-Owners Insurance.  Medicamentos para las nauseas  Es seguro tomar doxilamina y vitamina B6 segn las indicaciones. Si fuera necesario, existen otros medicamentos con receta disponibles.  Pldoras para dormir  Es seguro tomar difenhidramina y paracetamol con difenhidramina.  Corticoides  Las cremas con hidrocortisona son seguras si se usan como se indica. Los corticoides por va oral requieren prescripcin mdica. No es Dietitian crema para las hemorroides que contengan pramoxina o fenilefrina.  Laxantes  Es seguro tomar laxantes. Evite su uso diario o prolongado.  Medicamentos para la tiroides  Es importante que siga usando el medicamento para la  tiroides. Necesita ser controlada por su mdico.  Medicamentos para uso vaginal  El mdico le recetar un medicamento si usted tiene una infeccin vaginal. Ciertos medicamentos antifngicos son seguros si sufre una infeccin de transmisin sexual (ETS). Consulte a su mdico.  Document Released: 07/04/2005 Document Revised: 03/06/2013 [facility] Patient Information 2015 [place], [place]. This information is not intended to replace advice given to you by your health care provider. Make sure you discuss any questions you have with your health care provider.  Cuidados prenatales  (Prenatal Care ) QU SON LOS CUIDADOS PRENATALES?  Los cuidados prenatales se relacionan con la atencin de la salud durante el [place], antes de que nazca el beb. Durante el [place], es muy importante que haga lo siguiente para cuidar de usted y de su beb:   Recibir cuidados prenatales de forma temprana. Si sabe que est embarazada, o cree que podra estarlo, llame a su mdico lo antes posible. Programe una visita para un examen prenatal.  Recibir cuidados prenatales con regularidad. Cumpla con el esquema de anlisis de [place] y otros estudios necesarios que le indique su mdico. No falte a las citas.  Hacer todo lo posible para que usted y su beb estn sanos durante el embarazo.  Recibir [employer] cuidados necesarios. Los cuidados prenatales deben incluir la evaluacin de las necesidades mdicas, nutricionales, [place], psicolgicas y [place] de la embarazada y su pareja. Debe hablar con su mdico sobre la historia clnica y gentica de su familia y de la familia del padre del beb.  Informar a su mdico sobre lo siguiente:  Los medicamentos recetados, de Kinder y a base de hierbas que toma.  Cualquier antecedente de abuso de sustancias, consumo de alcohol, hbito de fumar y uso de Lexicographer.  Cualquier antecedente de violencia domstica o de otro tipo.  Las vacunas que ha recibido.  Su  nutricin y su dieta.  La cantidad de ejercicio que hace.  Cualquier peligro ambiental y ocupacional al que est expuesta.  Antecedentes de infecciones de transmisin sexual, tanto suyos como de su  pareja.  Embarazos previos. POR QU SON TAN IMPORTANTES LOS CUIDADOS PRENATALES?  Al visitar con regularidad a su mdico, usted ayuda a [employer] se identifiquen de forma temprana, para poder tratarlos lo antes posible. Tambin podran prevenirse otros problemas. Muchos estudios han demostrado que los cuidados prenatales brindados de forma temprana y con regularidad son importantes para la salud de las madres y los bebs.  CMO PUEDO CUIDARME DURANTE EL EMBARAZO?  Puede cuidar de usted y de su beb de los siguientes modos:   Comience a IT consultant con (mcg) de cido flico todos los [place], o siga tomndolo.  Reciba cuidados prenatales de forma temprana y con regularidad. Es muy importante que vea a un mdico Academic librarian. Su mdico la examinar en cada visita para asegurarse de que usted y el beb estn sanos. Si hay algn problema, se puede actuar de inmediato para ayudarlos a usted y al beb.  Consuma una dieta saludable que incluya:  Frutas.  Verduras.  Alimentos con bajo contenido de grasas saturadas.  Cereales integrales.  Alimentos con alto contenido de calcio, [place], yogur y [place] duros.  Beba entre 6 y 8 vasos de lquidos por Futures trader.  A menos que su mdico le indique otra cosa, intente hacer actividad fsica durante , todos [place]. Si no le [employer], puede hacer actividad fsica en perodos de , tres veces al [employer].  No fume, no beba alcohol ni consuma drogas. Estos pueden causarle daos a largo plazo al beb. Hable con su mdico [employer] Inc a seguir para dejar de fumar. Hable con un miembro de su comunidad religiosa, un Futures trader, un amigo de [place] o con su mdico en el caso de que  consuma alcohol o drogas y est preocupada al [place].  Pregntele a su mdico antes de tomar cualquier medicamento, incluso los de [address]. No es seguro tomar algunos medicamentos durante Firefighter.  Descanse y duerma lo suficiente.  Evite jacuzzis y saunas durante el [address]  No se haga radiografas, a menos que sean absolutamente necesarias y que se las indique su mdico. Pueden colocarle una pantalla de plomo sobre el abdomen para proteger al beb cuando le hagan radiografas en otras partes del cuerpo.  No limpie la arena higinica para gatos Academic librarian. Puede contener un parsito que causa una infeccin llamada toxoplasmosis, que puede provocar defectos congnitos. Adems, use guantes al trabajar en reas del jardn que usen los gatos.  No coma carne de vaca, pollo o pescado que est cruda o poco cocida.  No coma quesos madurados con hongos (brie, camembert y de [place]) ni quesos azules blandos (dans y roquefort).  Aljese de sustancias qumicas txicas como las siguientes:  Insecticidas.  Solventes (algunos agentes de limpieza o diluyentes de pintura).  Plomo.  Mercurio.  Puede tener relaciones sexuales hasta el final del [place], excepto si tiene algn problema mdico o alguna dificultad en el Psychiatrist y su mdico le indica que no las tenga.  No use tacones altos, especialmente durante la segunda mitad del [place]. Puede perder el equilibrio y caerse.  No haga viajes largos, a menos que sean absolutamente necesarios. Asegrese de visitar al mdico antes de hacer el viaje.  No permanezca sentada en una posicin durante ms de 2 horas cuando viaje.  Lleve una copia de su historia clnica cuando viaje. Averige adnde hay un hospital en la ciudad que visitar, en caso de emergencia.  La mayora de los productos de  limpieza tienen advertencias sobre Recruitment consultantel embarazo en la etiqueta. Si no est segura, consulte a su mdico acerca de los productos.  Limite o  elimine la cafena de su dieta, evite el caf, t, bebidas gaseosas, medicamentos y chocolate.  Muchas mujeres siguen trabajando Academic librariandurante el embarazo. Estar activa puede ayudarla a estar ms sana. Si tiene dudas Intelsobre la seguridad de un trabajo especfico o las horas que debera trabajar, hable con su mdico.  Infrmese:  Lea libros.  OmnicomMire videos.  Vaya a las clases de preparto con su pareja.  Hable con mams experimentadas.  Pregntele a su mdico sobre las clases preparto para usted y Risk analystsu pareja. Las clases la ayudarn a usted y a su compaero a prepararse para el nacimiento de su beb.  Busque un mdico de bebs Teacher, music(pediatra) y consulte acerca de los mtodos y los medicamentos para Acupuncturistaliviar el dolor durante el Wardv[place]rabajo de [place], Oregonel parto y en el caso de que se le haga una cesrea. CON QU FRECUENCIA DEBO VISITAR A MI MDICO DURANTE EL EMBARAZO?  Su mdico le dar un esquema con las visitas prenatales. Las visitas sern ms frecuentes a medida que se acerque el final del Psychiatristembarazo. Un embarazo promedio dura aproximadamente de 40semanas.  Un esquema habitual incluye visitar al mdico con la siguiente frecuencia:   Aproximadamente una vez al Thrivent Financialmes durante los primeros 6meses de [place]embarazo.  Cada 2semanas durante los prximos 2meses.  Una vez por semana durante el ltimo mes, hasta la fecha de [place]parto. Es probable que su mdico quiera verla ms a menudo en los siguientes casos:  Tiene ms de 35aos.  Su embarazo es de alto riesgo debido a que usted tiene ciertos problemas de salud o con el Psychiatristembarazo, por ejemplo:  Diabetes.  Hipertensin arterial.  El beb no se desarrolla como debera, segn las fechas del Genevaembarazo. Su mdico le har estudios especiales para asegurarse de que usted y el beb no tengan problemas graves. QU SUCEDE DURANTE LAS VISITAS PRENATALES?   En la primera visita prenatal, su mdico le har un examen fsico y hablar con usted sobre sus antecedentes mdicos, los  de su pareja y su familia. Su mdico podr decirle la fecha probable de parto.  Su primer examen fsico incluir controles de la presin arterial, el peso y la altura, y un examen de los rganos de la pelvis. Su mdico le har un Papanicolaou si no le han hecho uno recientemente y har cultivos del cuello del tero para descartar infecciones.  En cada visita prenatal, le harn anlisis de [place] y [place], Cytogeneticistle tomarn la presin arterial, le controlarn el peso y verificarn el desarrollo del beb.  En sus ltimas visitas prenatales, su mdico examinar su estado y el desarrollo del beb. Es posible que le hagan varios estudios a medida que avance el Kamiahembarazo.  A menudo, se hacen ecografas para verificar el crecimiento y la salud del beb.  Puede ser que le hagan ms anlisis de sangre y [place], Alaskaadems de estudios especiales, segn sea necesario. Estos pueden incluir una amniocentesis para examinar el lquido del saco amnitico, pruebas de estrs para ver cmo responde el beb a las contracciones o un perfil biofsico para Teacher, musiccontrolar el bienestar del beb. Su mdico le Hess Corporationexplicar los estudios y la necesidad de cada uno.  Entre la semana 24 y 28 de Psychiatristembarazo, Chief Executive Officerle harn un anlisis para saber si tiene un nivel alto de azcar en la sangre (diabetes gestacional).  Debe hablar con el mdico sobre sus planes  de amamantar o darle el bibern a su beb.  En cada visita, tambin tendr la posibilidad de aprender a mantenerse saludable durante el embarazo y de hacer preguntas. Document Released: 12/21/2007 Document Revised: 07/09/2013 [facility] Patient Information 2015 [place]roseExitCare, MarylandLLC. This information is not intended to replace advice given to you by your health care provider. Make sure you discuss any questions you have with your health care provider.

## 2014-01-10 NOTE — ED Notes (Signed)
Pt A&OX4, ambulatory at d/c with steady gait, declined wheelchair, NAD. 

## 2014-01-12 ENCOUNTER — Telehealth (HOSPITAL_BASED_OUTPATIENT_CLINIC_OR_DEPARTMENT_OTHER): Payer: Self-pay | Admitting: Emergency Medicine

## 2014-01-12 NOTE — Telephone Encounter (Signed)
Positive Chlamydia culture Chart sent to EDP for review 

## 2014-01-30 ENCOUNTER — Ambulatory Visit (INDEPENDENT_AMBULATORY_CARE_PROVIDER_SITE_OTHER): Payer: Self-pay | Admitting: Physician Assistant

## 2014-01-30 VITALS — BP 110/72 | HR 100 | Temp 97.8°F | Resp 16 | Ht 60.5 in | Wt 193.6 lb

## 2014-01-30 DIAGNOSIS — Z3490 Encounter for supervision of normal pregnancy, unspecified, unspecified trimester: Secondary | ICD-10-CM

## 2014-01-30 DIAGNOSIS — A749 Chlamydial infection, unspecified: Secondary | ICD-10-CM

## 2014-01-30 DIAGNOSIS — Z331 Pregnant state, incidental: Secondary | ICD-10-CM

## 2014-01-30 MED ORDER — AZITHROMYCIN 500 MG PO TABS: 1000.0000 mg | ORAL_TABLET | Freq: Once | ORAL | Status: DC

## 2014-01-30 NOTE — Progress Notes (Signed)
   Subjective:    Patient ID: Carla Sutton, female    DOB: Apr 13, 1986, 28 y.o.   MRN: [id number]  HPI 28 year old female presents for treatment after a + chlamydia test in the E.D. Was instructed to come to an UC for treatment. She is pregnant, but unsure of gestational age. LNMP 4/30, but had a negative serum HCG on 6/9. Then found to have +HCG on 6/25. Based on quant HCG at that time, she was less than [redacted] weeks pregnant. She was also treated for BV with Flagyl which she completed yesterday.  Denies any vaginal discharge, pelvic pain, odor, abdominal pain, vomiting, bleeding, or gush of fluids. Does admit to some abdominal cramping and nausea.  Currently taking prenatal vitamins. No other daily medications.    Review of Systems  Constitutional: Negative for fever and chills.  Gastrointestinal: Positive for nausea. Negative for vomiting and abdominal pain (slight abdominal cramping).  Genitourinary: Negative for dysuria, vaginal bleeding, vaginal discharge, vaginal pain and pelvic pain.       Objective:   Physical Exam  Constitutional: She is oriented to person, place, and time. She appears well-developed and well-nourished.  HENT:  Head: Normocephalic and atraumatic.  Right Ear: External ear normal.  Left Ear: External ear normal.  Eyes: Conjunctivae are normal.  Neck: Normal range of motion.  Cardiovascular: Normal rate.   Pulmonary/Chest: Effort normal.  Abdominal: There is no tenderness. There is no rigidity and no guarding.  Genitourinary: Vagina normal and uterus normal. Pelvic exam was performed with patient supine. There is no rash or tenderness on the right labia. There is no rash or tenderness on the left labia. Cervix exhibits no motion tenderness, no discharge and no friability. Right adnexum displays no mass, no tenderness and no fullness. Left adnexum displays no mass, no tenderness and no fullness.  Neurological: She is alert and oriented to person, place, and time.    Psychiatric: She has a normal mood and affect. Her behavior is normal. Judgment and thought content normal.          Assessment & Plan:  Chlamydia - Plan: azithromycin (ZITHROMAX) 500 MG tablet  Pregnancy  Continue prenatal vitamins daily Anticipatory guidance.  Will treat chlamydia with azithromycin 1 gram. Discussed need to abstain for 7 days and all sexual partners must also be treated.

## 2014-03-06 ENCOUNTER — Other Ambulatory Visit (HOSPITAL_COMMUNITY)
Admission: RE | Admit: 2014-03-06 | Discharge: 2014-03-06 | Disposition: A | Discharge status: Home / Self Care | Payer: Medicaid Other | Source: Ambulatory Visit | Attending: Family Medicine | Admitting: Family Medicine

## 2014-03-06 ENCOUNTER — Encounter: Payer: Self-pay | Admitting: Family Medicine

## 2014-03-06 ENCOUNTER — Ambulatory Visit (INDEPENDENT_AMBULATORY_CARE_PROVIDER_SITE_OTHER): Payer: Medicaid Other | Admitting: Family Medicine

## 2014-03-06 ENCOUNTER — Other Ambulatory Visit (HOSPITAL_COMMUNITY): Admission: RE | Admit: 2014-03-06 | Payer: Medicaid Other | Source: Ambulatory Visit | Admitting: Family Medicine

## 2014-03-06 VITALS — BP 134/70 | HR 100 | Temp 97.7°F | Wt 188.5 lb

## 2014-03-06 DIAGNOSIS — Z3480 Encounter for supervision of other normal pregnancy, unspecified trimester: Secondary | ICD-10-CM | POA: Diagnosis not present

## 2014-03-06 DIAGNOSIS — Z124 Encounter for screening for malignant neoplasm of cervix: Secondary | ICD-10-CM | POA: Diagnosis present

## 2014-03-06 DIAGNOSIS — Z3492 Encounter for supervision of normal pregnancy, unspecified, second trimester: Secondary | ICD-10-CM

## 2014-03-06 LAB — POCT URINALYSIS DIP (DEVICE)
BILIRUBIN URINE: NEGATIVE
Glucose, UA: NEGATIVE mg/dL
Hgb urine dipstick: NEGATIVE
Ketones, ur: NEGATIVE mg/dL
Nitrite: NEGATIVE
Protein, ur: NEGATIVE mg/dL
SPECIFIC GRAVITY, URINE: 1.015 (ref 1.005–1.030)
Urobilinogen, UA: 0.2 mg/dL (ref 0.0–1.0)
pH: 7 (ref 5.0–8.0)

## 2014-03-06 NOTE — Progress Notes (Signed)
C/o of nausea all day and no appetite.  Initial lab work today and early 1hr gtt; labs to be drawn at 1134.  New ob packet given with sheet of approved OTC medications for pregnancy.

## 2014-03-06 NOTE — Progress Notes (Signed)
Anatomy ultrasound scheduled 03/20/14 @ 8a.

## 2014-03-06 NOTE — Progress Notes (Signed)
   Subjective:    Carla Sutton is a G2P0010 [id number] being seen today for her first obstetrical visit.  Her obstetrical history is significant for obesity. Patient does intend to breast feed. Pregnancy history fully reviewed.  Patient reports nausea.  Filed Vitals:   03/06/14 1026  BP: 134/70  Pulse: 100  Temp: 97.7 F (36.5 C)  Weight: 188 lb 8 oz (85.503 kg)    HISTORY: OB History  Gravida Para Term Preterm AB SAB TAB Ectopic Multiple Living  2 0 0 0 1 1 0 0 0 0     # Outcome Date GA Lbr Len/2nd Weight Sex Delivery Anes PTL Lv  2 CUR           1 SAB 2013             SAB: at 7 wks  History reviewed. No pertinent past medical history. Past Surgical History  Procedure Laterality Date  . Dilation and curettage of uterus     Family History  Problem Relation Age of Onset  . Cancer Mother   . Diabetes Mother   . Heart disease Mother   . Hyperlipidemia Mother   . Hypertension Mother   . Hyperlipidemia Father   . Hypertension Father   . Diabetes Father      Exam    Uterus:     Pelvic Exam:    Perineum: No Hemorrhoids   Vulva: normal   Vagina:  normal mucosa, normal discharge       Cervix: nulliparous appearance   Adnexa: not evaluated      System: Breast:  normal appearance, no masses or tenderness   Skin: normal coloration and turgor, no rashes    Neurologic: oriented, normal, normal mood   Extremities: normal strength, tone, and muscle mass   HEENT PERRLA and extra ocular movement intact   Mouth/Teeth mucous membranes moist, pharynx normal without lesions   Neck supple and no masses   Cardiovascular: Regular rate   Respiratory:  appears well, vitals normal, no respiratory distress, acyanotic, normal RR, ear and throat exam is normal, neck free of mass or lymphadenopathy, chest clear, no wheezing, crepitations, rhonchi, normal symmetric air entry   Abdomen: soft, non-tender; bowel sounds normal; no masses,  no organomegaly   Urinary: urethral meatus normal       Assessment:    Pregnancy: G2P0010 There are no active problems to display for this patient.       Plan:     Initial labs drawn. Prenatal vitamins. Problem list reviewed and updated.  Ultrasound discussed; fetal survey: ordered.  Follow up in 4 weeks. 50% of 20 min visit spent on counseling and coordination of care.    Carla Sutton 03/06/2014

## 2014-03-07 LAB — OBSTETRIC PANEL
ANTIBODY SCREEN: NEGATIVE
BASOS PCT: 0 % (ref 0–1)
Basophils Absolute: 0 10*3/uL (ref 0.0–0.1)
Eosinophils Absolute: 0.2 10*3/uL (ref 0.0–0.7)
Eosinophils Relative: 4 % (ref 0–5)
HEMATOCRIT: 36.8 % (ref 36.0–46.0)
HEMOGLOBIN: 12.7 g/dL (ref 12.0–15.0)
Hepatitis B Surface Ag: NEGATIVE
Lymphocytes Relative: 23 % (ref 12–46)
Lymphs Abs: 1.4 10*3/uL (ref 0.7–4.0)
MCH: 29.7 pg (ref 26.0–34.0)
MCHC: 34.5 g/dL (ref 30.0–36.0)
MCV: 86 fL (ref 78.0–100.0)
MONO ABS: 0.3 10*3/uL (ref 0.1–1.0)
MONOS PCT: 5 % (ref 3–12)
NEUTROS ABS: 4.1 10*3/uL (ref 1.7–7.7)
Neutrophils Relative %: 68 % (ref 43–77)
Platelets: 256 10*3/uL (ref 150–400)
RBC: 4.28 MIL/uL (ref 3.87–5.11)
RDW: 13.4 % (ref 11.5–15.5)
RH TYPE: POSITIVE
Rubella: 3.43 Index — ABNORMAL HIGH (ref <0.90)
WBC: 6.1 10*3/uL (ref 4.0–10.5)

## 2014-03-07 LAB — PRESCRIPTION MONITORING PROFILE (19 PANEL)
Amphetamine/Meth: NEGATIVE ng/mL
BARBITURATE SCREEN, URINE: NEGATIVE ng/mL
Benzodiazepine Screen, Urine: NEGATIVE ng/mL
Buprenorphine, Urine: NEGATIVE ng/mL
CANNABINOID SCRN UR: NEGATIVE ng/mL
CREATININE, URINE: 267.83 mg/dL (ref >20.0)
Carisoprodol, Urine: NEGATIVE ng/mL
Cocaine Metabolites: NEGATIVE ng/mL
Fentanyl, Ur: NEGATIVE ng/mL
MDMA URINE: NEGATIVE ng/mL
Meperidine, Ur: NEGATIVE ng/mL
Methadone Screen, Urine: NEGATIVE ng/mL
Methaqualone: NEGATIVE ng/mL
Nitrites, Initial: NEGATIVE ug/mL
OPIATE SCREEN, URINE: NEGATIVE ng/mL
OXYCODONE SCRN UR: NEGATIVE ng/mL
PH URINE, INITIAL: 7.5 pH (ref 4.5–8.9)
Phencyclidine, Ur: NEGATIVE ng/mL
Propoxyphene: NEGATIVE ng/mL
TRAMADOL UR: NEGATIVE ng/mL
Tapentadol, urine: NEGATIVE ng/mL
Zolpidem, Urine: NEGATIVE ng/mL

## 2014-03-07 LAB — GLUCOSE TOLERANCE, 1 HOUR (50G) W/O FASTING: Glucose, 1 Hour GTT: 135 mg/dL (ref 70–140)

## 2014-03-07 LAB — CYTOLOGY - PAP

## 2014-03-07 LAB — HIV ANTIBODY (ROUTINE TESTING W REFLEX): HIV 1&2 Ab, 4th Generation: NONREACTIVE

## 2014-03-10 LAB — HEMOGLOBINOPATHY EVALUATION
HGB F QUANT: 0.4 % (ref 0.0–2.0)
Hemoglobin Other: 0 %
Hgb A2 Quant: 3 % (ref 2.2–3.2)
Hgb A: 96.6 % — ABNORMAL LOW (ref 96.8–97.8)
Hgb S Quant: 0 %

## 2014-03-10 LAB — CULTURE, OB URINE: Colony Count: >=100000

## 2014-03-13 ENCOUNTER — Telehealth: Payer: Self-pay | Admitting: *Deleted

## 2014-03-13 DIAGNOSIS — N390 Urinary tract infection, site not specified: Secondary | ICD-10-CM

## 2014-03-13 MED ORDER — NITROFURANTOIN MONOHYD MACRO 100 MG PO CAPS: 100.0000 mg | ORAL_CAPSULE | Freq: Two times a day (BID) | ORAL | Status: DC

## 2014-03-13 NOTE — Telephone Encounter (Signed)
Message copied by Mannie Stabile on Thu Mar 13, 2014  8:04 AM ------      Message from: Fredirick Lathe      Created: Thu Mar 13, 2014  3:45 AM      Regarding: rx       Please call in macrobid  BID x 5d for patient UNDER Dr. Deretha Emory name, also notify patient of +urine culture necessitating treatment.            Perry Mount, MD            ----- Message -----         From: [facility] in [facility]         Sent: 03/06/2014   9:52 PM           To: Perry Mount, MD                   ------

## 2014-03-13 NOTE — Telephone Encounter (Signed)
Rx sent to pharmacy   

## 2014-03-13 NOTE — Telephone Encounter (Signed)
Called patient with Carla Sutton and informed her of results.

## 2014-03-20 ENCOUNTER — Ambulatory Visit (HOSPITAL_COMMUNITY)
Admission: RE | Admit: 2014-03-20 | Discharge: 2014-03-20 | Disposition: A | Discharge status: Home / Self Care | Payer: Medicaid Other | Source: Ambulatory Visit | Attending: Family Medicine | Admitting: Family Medicine

## 2014-03-20 ENCOUNTER — Other Ambulatory Visit: Payer: Self-pay | Admitting: Family Medicine

## 2014-03-20 DIAGNOSIS — Z3492 Encounter for supervision of normal pregnancy, unspecified, second trimester: Secondary | ICD-10-CM

## 2014-03-20 DIAGNOSIS — Z363 Encounter for antenatal screening for malformations: Secondary | ICD-10-CM | POA: Insufficient documentation

## 2014-03-20 DIAGNOSIS — Z1389 Encounter for screening for other disorder: Secondary | ICD-10-CM

## 2014-03-20 DIAGNOSIS — Z3402 Encounter for supervision of normal first pregnancy, second trimester: Secondary | ICD-10-CM

## 2014-03-26 DIAGNOSIS — Z3400 Encounter for supervision of normal first pregnancy, unspecified trimester: Secondary | ICD-10-CM | POA: Insufficient documentation

## 2014-04-02 ENCOUNTER — Ambulatory Visit (INDEPENDENT_AMBULATORY_CARE_PROVIDER_SITE_OTHER): Payer: Medicaid Other | Admitting: Advanced Practice Midwife

## 2014-04-02 ENCOUNTER — Encounter: Payer: Self-pay | Admitting: Advanced Practice Midwife

## 2014-04-02 VITALS — BP 115/78 | HR 110 | Temp 98.4°F | Wt 190.9 lb

## 2014-04-02 DIAGNOSIS — Z23 Encounter for immunization: Secondary | ICD-10-CM

## 2014-04-02 DIAGNOSIS — Z3480 Encounter for supervision of other normal pregnancy, unspecified trimester: Secondary | ICD-10-CM

## 2014-04-02 DIAGNOSIS — Z3402 Encounter for supervision of normal first pregnancy, second trimester: Secondary | ICD-10-CM

## 2014-04-02 DIAGNOSIS — Z3492 Encounter for supervision of normal pregnancy, unspecified, second trimester: Secondary | ICD-10-CM

## 2014-04-02 LAB — POCT URINALYSIS DIP (DEVICE)
Bilirubin Urine: NEGATIVE
Glucose, UA: NEGATIVE mg/dL
Hgb urine dipstick: NEGATIVE
Ketones, ur: NEGATIVE mg/dL
NITRITE: NEGATIVE
PH: 8.5 — AB (ref 5.0–8.0)
PROTEIN: NEGATIVE mg/dL
Specific Gravity, Urine: 1.015 (ref 1.005–1.030)
Urobilinogen, UA: 0.2 mg/dL (ref 0.0–1.0)

## 2014-04-02 NOTE — Progress Notes (Signed)
Patient is feeling well, no complaints.  +FM, no vaginal bleeding/LOF, no contractions.  Anatomy f/u ultrasound scheduled today.  Flu vaccine today.

## 2014-04-02 NOTE — Patient Instructions (Signed)
Second Trimester of Pregnancy The second trimester is from week 13 through week 28, months 4 through 6. The second trimester is often a time when you feel your best. Your body has also adjusted to being pregnant, and you begin to feel better physically. Usually, morning sickness has lessened or quit completely, you may have more energy, and you may have an increase in appetite. The second trimester is also a time when the fetus is growing rapidly. At the end of the sixth month, the fetus is about 9 inches long and weighs about 1 pounds. You will likely begin to feel the baby move (quickening) between 18 and 20 weeks of the pregnancy. BODY CHANGES Your body goes through many changes during pregnancy. The changes vary from woman to woman.   Your weight will continue to increase. You will notice your lower abdomen bulging out.  You may begin to get stretch marks on your hips, abdomen, and breasts.  You may develop headaches that can be relieved by medicines approved by your health care provider.  You may urinate more often because the fetus is pressing on your bladder.  You may develop or continue to have heartburn as a result of your pregnancy.  You may develop constipation because certain hormones are causing the muscles that push waste through your intestines to slow down.  You may develop hemorrhoids or swollen, bulging veins (varicose veins).  You may have back pain because of the weight gain and pregnancy hormones relaxing your joints between the bones in your pelvis and as a result of a shift in weight and the muscles that support your balance.  Your breasts will continue to grow and be tender.  Your gums may bleed and may be sensitive to brushing and flossing.  Dark spots or blotches (chloasma, mask of pregnancy) may develop on your face. This will likely fade after the baby is born.  A dark line from your belly button to the pubic area (linea nigra) may appear. This will likely fade  after the baby is born.  You may have changes in your hair. These can include thickening of your hair, rapid growth, and changes in texture. Some women also have hair loss during or after pregnancy, or hair that feels dry or thin. Your hair will most likely return to normal after your baby is born. WHAT TO EXPECT AT YOUR PRENATAL VISITS During a routine prenatal visit:  You will be weighed to make sure you and the fetus are growing normally.  Your blood pressure will be taken.  Your abdomen will be measured to track your baby's growth.  The fetal heartbeat will be listened to.  Any test results from the previous visit will be discussed. Your health care provider may ask you:  How you are feeling.  If you are feeling the baby move.  If you have had any abnormal symptoms, such as leaking fluid, bleeding, severe headaches, or abdominal cramping.  If you have any questions. Other tests that may be performed during your second trimester include:  Blood tests that check for:  Low iron levels (anemia).  Gestational diabetes (between 24 and 28 weeks).  Rh antibodies.  Urine tests to check for infections, diabetes, or protein in the urine.  An ultrasound to confirm the proper growth and development of the baby.  An amniocentesis to check for possible genetic problems.  Fetal screens for spina bifida and Down syndrome. HOME CARE INSTRUCTIONS   Avoid all smoking, herbs, alcohol, and unprescribed   drugs. These chemicals affect the formation and growth of the baby.  Follow your health care provider's instructions regarding medicine use. There are medicines that are either safe or unsafe to take during pregnancy.  Exercise only as directed by your health care provider. Experiencing uterine cramps is a good sign to stop exercising.  Continue to eat regular, healthy meals.  Wear a good support bra for breast tenderness.  Do not use hot tubs, steam rooms, or saunas.  Wear your  seat belt at all times when driving.  Avoid raw meat, uncooked cheese, cat litter boxes, and soil used by cats. These carry germs that can cause birth defects in the baby.  Take your prenatal vitamins.  Try taking a stool softener (if your health care provider approves) if you develop constipation. Eat more high-fiber foods, such as fresh vegetables or fruit and whole grains. Drink plenty of fluids to keep your urine clear or pale yellow.  Take warm sitz baths to soothe any pain or discomfort caused by hemorrhoids. Use hemorrhoid cream if your health care provider approves.  If you develop varicose veins, wear support hose. Elevate your feet for 15 minutes, 3-4 times a day. Limit salt in your diet.  Avoid heavy lifting, wear low heel shoes, and practice good posture.  Rest with your legs elevated if you have leg cramps or low back pain.  Visit your dentist if you have not gone yet during your pregnancy. Use a soft toothbrush to brush your teeth and be gentle when you floss.  A sexual relationship may be continued unless your health care provider directs you otherwise.  Continue to go to all your prenatal visits as directed by your health care provider. SEEK MEDICAL CARE IF:   You have dizziness.  You have mild pelvic cramps, pelvic pressure, or nagging pain in the abdominal area.  You have persistent nausea, vomiting, or diarrhea.  You have a bad smelling vaginal discharge.  You have pain with urination. SEEK IMMEDIATE MEDICAL CARE IF:   You have a fever.  You are leaking fluid from your vagina.  You have spotting or bleeding from your vagina.  You have severe abdominal cramping or pain.  You have rapid weight gain or loss.  You have shortness of breath with chest pain.  You notice sudden or extreme swelling of your face, hands, ankles, feet, or legs.  You have not felt your baby move in over an hour.  You have severe headaches that do not go away with  medicine.  You have vision changes. Document Released: 06/28/2001 Document Revised: 07/09/2013 Document Reviewed: 09/04/2012 ExitCare Patient Information 2015 ExitCare[employer]. This information is not intended to replace advice given to you by your health care provider. Make sure you discuss any questions you have with your health care provider.  

## 2014-04-02 NOTE — Progress Notes (Signed)
Seen also by me.  Agree with note. 

## 2014-04-10 ENCOUNTER — Other Ambulatory Visit: Payer: Self-pay | Admitting: Advanced Practice Midwife

## 2014-04-10 ENCOUNTER — Ambulatory Visit (HOSPITAL_COMMUNITY)
Admission: RE | Admit: 2014-04-10 | Discharge: 2014-04-10 | Disposition: A | Discharge status: Home / Self Care | Payer: Medicaid Other | Source: Ambulatory Visit | Attending: Advanced Practice Midwife | Admitting: Advanced Practice Midwife

## 2014-04-10 ENCOUNTER — Ambulatory Visit (HOSPITAL_COMMUNITY): Payer: Medicaid Other

## 2014-04-10 DIAGNOSIS — Z3689 Encounter for other specified antenatal screening: Secondary | ICD-10-CM | POA: Insufficient documentation

## 2014-04-10 DIAGNOSIS — Z3492 Encounter for supervision of normal pregnancy, unspecified, second trimester: Secondary | ICD-10-CM
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
## 2014-04-11 ENCOUNTER — Encounter (HOSPITAL_COMMUNITY): Payer: Self-pay | Admitting: Advanced Practice Midwife

## 2014-04-21 ENCOUNTER — Encounter: Payer: Self-pay | Admitting: Family Medicine

## 2014-04-30 ENCOUNTER — Ambulatory Visit (INDEPENDENT_AMBULATORY_CARE_PROVIDER_SITE_OTHER): Payer: Medicaid Other | Admitting: Family

## 2014-04-30 VITALS — BP 125/69 | HR 105 | Wt 196.7 lb

## 2014-04-30 DIAGNOSIS — Z3402 Encounter for supervision of normal first pregnancy, second trimester: Secondary | ICD-10-CM

## 2014-04-30 LAB — POCT URINALYSIS DIP (DEVICE)
Bilirubin Urine: NEGATIVE
Glucose, UA: NEGATIVE mg/dL
Hgb urine dipstick: NEGATIVE
Ketones, ur: NEGATIVE mg/dL
LEUKOCYTES UA: NEGATIVE
Nitrite: NEGATIVE
PH: 7 (ref 5.0–8.0)
PROTEIN: NEGATIVE mg/dL
Specific Gravity, Urine: 1.015 (ref 1.005–1.030)
Urobilinogen, UA: 0.2 mg/dL (ref 0.0–1.0)

## 2014-04-30 NOTE — Progress Notes (Signed)
Pt reports a lot of constipation.

## 2014-04-30 NOTE — Progress Notes (Signed)
Doing well, just increased hardened stool.  Last BM yesterday.  Recommended prune juice.  Reviewed ultrasound results.

## 2014-04-30 NOTE — Addendum Note (Signed)
Addended by: Jill SideAY, Kensie Susman L on: 04/30/2014 10:27 AM   Modules accepted: Orders

## 2014-04-30 NOTE — Addendum Note (Signed)
Addended by: Marlis EdelsonKARIM, [place]uph[place] N on: 04/30/2014 10:09 AM   Modules accepted: Orders

## 2014-05-05 LAB — AFP, QUAD SCREEN
AFP: 55.6 ng/mL
Age Alone: 1:958 {titer}
Curr Gest Age: 21.3 wks.days
Down Syndrome Scr Risk Est: 1:3720 {titer}
HCG, Total: 29.01 IU/mL
INH: 200.8 pg/mL
INTERPRETATION-AFP: NEGATIVE
MOM FOR INH: 1.11
MoM for AFP: 0.94
MoM for hCG: 1.68
Open Spina bifida: NEGATIVE
Osb Risk: 1:16400 {titer}
Tri 18 Scr Risk Est: NEGATIVE
UE3 MOM: 1.1
uE3 Value: 2.41 ng/mL

## 2014-05-06 ENCOUNTER — Encounter: Payer: Self-pay | Admitting: Family

## 2014-05-12 ENCOUNTER — Ambulatory Visit (HOSPITAL_COMMUNITY)
Admission: RE | Admit: 2014-05-12 | Discharge: 2014-05-12 | Disposition: A | Discharge status: Home / Self Care | Payer: Medicaid Other | Source: Ambulatory Visit | Attending: Family | Admitting: Family

## 2014-05-12 DIAGNOSIS — Z36 Encounter for antenatal screening of mother: Secondary | ICD-10-CM | POA: Diagnosis not present

## 2014-05-12 DIAGNOSIS — Z3A23 23 weeks gestation of pregnancy: Secondary | ICD-10-CM | POA: Insufficient documentation

## 2014-05-12 DIAGNOSIS — O99212 Obesity complicating pregnancy, second trimester: Secondary | ICD-10-CM | POA: Diagnosis not present

## 2014-05-12 DIAGNOSIS — Z3402 Encounter for supervision of normal first pregnancy, second trimester: Secondary | ICD-10-CM

## 2014-05-12 DIAGNOSIS — IMO0002 Reserved for concepts with insufficient information to code with codable children: Secondary | ICD-10-CM

## 2014-05-12 DIAGNOSIS — Z0489 Encounter for examination and observation for other specified reasons: Secondary | ICD-10-CM

## 2014-05-19 ENCOUNTER — Encounter: Payer: Self-pay | Admitting: Family Medicine

## 2014-05-28 ENCOUNTER — Ambulatory Visit (INDEPENDENT_AMBULATORY_CARE_PROVIDER_SITE_OTHER): Payer: Medicaid Other | Admitting: Advanced Practice Midwife

## 2014-05-28 VITALS — BP 119/79 | HR 99 | Temp 97.0°F | Wt 204.1 lb

## 2014-05-28 DIAGNOSIS — Z3402 Encounter for supervision of normal first pregnancy, second trimester: Secondary | ICD-10-CM

## 2014-05-28 LAB — POCT URINALYSIS DIP (DEVICE)
BILIRUBIN URINE: NEGATIVE
Glucose, UA: NEGATIVE mg/dL
Hgb urine dipstick: NEGATIVE
Ketones, ur: NEGATIVE mg/dL
Leukocytes, UA: NEGATIVE
Nitrite: NEGATIVE
PROTEIN: NEGATIVE mg/dL
SPECIFIC GRAVITY, URINE: 1.02 (ref 1.005–1.030)
Urobilinogen, UA: 0.2 mg/dL (ref 0.0–1.0)
pH: 7.5 (ref 5.0–8.0)

## 2014-05-28 NOTE — Patient Instructions (Signed)
Preterm Labor Information Preterm labor is when labor starts at less than 37 weeks of pregnancy. The normal length of a pregnancy is 39 to 41 weeks. CAUSES Often, there is no identifiable underlying cause as to why a woman goes into preterm labor. One of the most common known causes of preterm labor is infection. Infections of the uterus, cervix, vagina, amniotic sac, bladder, kidney, or even the lungs (pneumonia) can cause labor to start. Other suspected causes of preterm labor include:   Urogenital infections, such as yeast infections and bacterial vaginosis.   Uterine abnormalities (uterine shape, uterine septum, fibroids, or bleeding from the placenta).   A cervix that has been operated on (it may fail to stay closed).   Malformations in the fetus.   Multiple gestations (twins, triplets, and so on).   Breakage of the amniotic sac.  RISK FACTORS  Having a previous history of preterm labor.   Having premature rupture of membranes (PROM).   Having a placenta that covers the opening of the cervix (placenta previa).   Having a placenta that separates from the uterus (placental abruption).   Having a cervix that is too weak to hold the fetus in the uterus (incompetent cervix).   Having too much fluid in the amniotic sac (polyhydramnios).   Taking illegal drugs or smoking while pregnant.   Not gaining enough weight while pregnant.   Being younger than 62 and older than 26 years old.   Having a low socioeconomic status.   Being African American. SYMPTOMS Signs and symptoms of preterm labor include:   Menstrual-like cramps, abdominal pain, or back pain.  Uterine contractions that are regular, as frequent as six in an hour, regardless of their intensity (may be mild or painful).  Contractions that start on the top of the uterus and spread down to the lower abdomen and back.   A sense of increased pelvic pressure.   A watery or bloody mucus discharge that  comes from the vagina.  TREATMENT Depending on the length of the pregnancy and other circumstances, your health care provider may suggest bed rest. If necessary, there are medicines that can be given to stop contractions and to mature the fetal lungs. If labor happens before 34 weeks of pregnancy, a prolonged hospital stay may be recommended. Treatment depends on the condition of both you and the fetus.  WHAT SHOULD YOU DO IF YOU THINK YOU ARE IN PRETERM LABOR? Call your health care provider right away. You will need to go to the hospital to get checked immediately. HOW CAN YOU PREVENT PRETERM LABOR IN FUTURE PREGNANCIES? You should:   Stop smoking if you smoke.  Maintain healthy weight gain and avoid chemicals and drugs that are not necessary.  Be watchful for any type of infection.  Inform your health care provider if you have a known history of preterm labor. Document Released: 09/24/2003 Document Revised: 03/06/2013 Document Reviewed: 08/06/2012 [facility] Patient Information 2015 [place], [place]. This information is not intended to replace advice given to you by your health care provider. Make sure you discuss any questions you have with your health care provider.  Tdap Vaccine (Tetanus, Diphtheria, Pertussis): What You Need to Know 1. Why get vaccinated? Tetanus, diphtheria and pertussis can be very serious diseases, even for adolescents and adults. Tdap vaccine can protect [place] from these diseases. TETANUS (Lockjaw) causes painful muscle tightening and stiffness, usually all over the body.  It can lead to tightening of muscles in the head and neck so you  can't open your mouth, swallow, or sometimes even breathe. Tetanus kills about 1 out of 5 people who are infected. DIPHTHERIA can cause a thick coating to form in the back of the throat.  It can lead to breathing problems, paralysis, heart failure, and death. PERTUSSIS (Whooping Cough) causes severe coughing spells, which can cause  difficulty breathing, vomiting and disturbed sleep.  It can also lead to weight loss, incontinence, and rib fractures. Up to 2 in 100 adolescents and 5 in 100 adults with pertussis are hospitalized or have complications, which could include pneumonia or death. These diseases are caused by bacteria. Diphtheria and pertussis are spread from person to person through coughing or sneezing. Tetanus enters the body through cuts, scratches, or wounds. Before vaccines, the [place] saw as many as 200,000 cases a year of diphtheria and pertussis, and hundreds of cases of tetanus. Since vaccination began, tetanus and diphtheria have dropped by about 99% and pertussis by about 80%. 2. Tdap vaccine Tdap vaccine can protect adolescents and adults from tetanus, diphtheria, and pertussis. One dose of Tdap is routinely given at age 26 or 7112. People who did not get Tdap at that age should get it as soon as possible. Tdap is especially important for health care professionals and anyone having close contact with a baby younger than 12 months. Pregnant women should get a dose of Tdap during every pregnancy, to protect the newborn from pertussis. Infants are most at risk for severe, life-threatening complications from pertussis. A similar vaccine, called Td, protects from tetanus and diphtheria, but not pertussis. A Td booster should be given every 10 years. Tdap may be given as one of these boosters if you have not already gotten a dose. Tdap may also be given after a severe cut or burn to prevent tetanus infection. Your doctor can give you more information. Tdap may safely be given at the same time as other vaccines. 3. Some people should not get this vaccine  If you ever had a life-threatening allergic reaction after a dose of any tetanus, diphtheria, or pertussis containing vaccine, OR if you have a severe allergy to any part of this vaccine, you should not get Tdap. Tell your doctor if you have any severe  allergies.  If you had a coma, or long or multiple seizures within 7 days after a childhood dose of DTP or DTaP, you should not get Tdap, unless a cause other than the vaccine was found. You can still get Td.  Talk to your doctor if you:  have epilepsy or another nervous system problem,  had severe pain or swelling after any vaccine containing diphtheria, tetanus or pertussis,  ever had Guillain-Barr Syndrome (GBS),  aren't feeling well on the day the shot is scheduled. 4. Risks of a vaccine reaction With any medicine, including vaccines, there is a chance of side effects. These are usually mild and go away on their own, but serious reactions are also possible. Brief fainting spells can follow a vaccination, leading to injuries from falling. Sitting or lying down for about 15 minutes can help prevent these. Tell your doctor if you feel dizzy or light-headed, or have vision changes or ringing in the ears. Mild problems following Tdap (Did not interfere with activities)  Pain where the shot was given (about 3 in 4 adolescents or 2 in 3 adults)  Redness or swelling where the shot was given (about 1 person in 5)  Mild fever of at least 100.110F (up to about 1  in 25 adolescents or 1 in 100 adults)  Headache (about 3 or 4 people in 10)  Tiredness (about 1 person in 3 or 4)  Nausea, vomiting, diarrhea, stomach ache (up to 1 in 4 adolescents or 1 in 10 adults)  Chills, body aches, sore joints, rash, swollen glands (uncommon) Moderate problems following Tdap (Interfered with activities, but did not require medical attention)  Pain where the shot was given (about 1 in 5 adolescents or 1 in 100 adults)  Redness or swelling where the shot was given (up to about 1 in 16 adolescents or 1 in 25 adults)  Fever over 102F (about 1 in 100 adolescents or 1 in 250 adults)  Headache (about 3 in 20 adolescents or 1 in 10 adults)  Nausea, vomiting, diarrhea, stomach ache (up to 1 or 3 people in  100)  Swelling of the entire arm where the shot was given (up to about 3 in 100). Severe problems following Tdap (Unable to perform usual activities; required medical attention)  Swelling, severe pain, bleeding and redness in the arm where the shot was given (rare). A severe allergic reaction could occur after any vaccine (estimated less than 1 in a million doses). 5. What if there is a serious reaction? What should I look for?  Look for anything that concerns you, such as signs of a severe allergic reaction, very high fever, or behavior changes. Signs of a severe allergic reaction can include hives, swelling of the face and throat, difficulty breathing, a fast heartbeat, dizziness, and weakness. These would start a few minutes to a few hours after the vaccination. What should I do?  If you think it is a severe allergic reaction or other emergency that can't wait, call 9-1-1 or get the person to the nearest hospital. Otherwise, call your doctor.  Afterward, the reaction should be reported to the "Vaccine Adverse Event Reporting System" (VAERS). Your doctor might file this report, or you can do it yourself through the VAERS web site at www.vaers.hhs.gov, or by calling 1-800-822-7967. VAERS is only for reporting reactions. They do not give medical advice.  6. The National Vaccine Injury Compensation Program The National Vaccine Injury Compensation Program (VICP) is a federal program that was created to compensate people who may have been injured by certain vaccines. Persons who believe they may have been injured by a vaccine can learn about the program and about filing a claim by calling 1-800-338-2382 or visiting the VICP website at www.hrsa.gov/vaccinecompensation. 7. How can I learn more?  Ask your doctor.  Call your local or state health department.  Contact the Centers for Disease Control and Prevention (CDC):  Call 1-800-232-4636 or visit CDC's website at www.cdc.gov/vaccines. CDC  Tdap Vaccine VIS (11/24/11) Document Released: 01/03/2012 Document Revised: 11/18/2013 Document Reviewed: 10/16/2013 [employer] Patient Information 2015 [employer]. This information is not intended to replace advice given to you by your health care provider. Make sure you discuss any questions you have with your health care provider.  

## 2014-05-28 NOTE — Progress Notes (Signed)
Given pt info on medication that can be taken in pregnancy

## 2014-06-02 ENCOUNTER — Inpatient Hospital Stay (HOSPITAL_COMMUNITY)
Admission: AD | Admit: 2014-06-02 | Discharge: 2014-06-02 | Disposition: A | Discharge status: Home / Self Care | Payer: Medicaid Other | Source: Ambulatory Visit | Attending: Obstetrics and Gynecology | Admitting: Obstetrics and Gynecology

## 2014-06-02 ENCOUNTER — Encounter (HOSPITAL_COMMUNITY): Payer: Self-pay | Admitting: *Deleted

## 2014-06-02 DIAGNOSIS — Z3A26 26 weeks gestation of pregnancy: Secondary | ICD-10-CM | POA: Diagnosis not present

## 2014-06-02 DIAGNOSIS — H66001 Acute suppurative otitis media without spontaneous rupture of ear drum, right ear: Secondary | ICD-10-CM

## 2014-06-02 DIAGNOSIS — O26892 Other specified pregnancy related conditions, second trimester: Secondary | ICD-10-CM | POA: Diagnosis present

## 2014-06-02 DIAGNOSIS — H6690 Otitis media, unspecified, unspecified ear: Secondary | ICD-10-CM | POA: Diagnosis not present

## 2014-06-02 DIAGNOSIS — R51 Headache: Secondary | ICD-10-CM | POA: Diagnosis not present

## 2014-06-02 DIAGNOSIS — R05 Cough: Secondary | ICD-10-CM | POA: Diagnosis not present

## 2014-06-02 DIAGNOSIS — J3489 Other specified disorders of nose and nasal sinuses: Secondary | ICD-10-CM | POA: Diagnosis not present

## 2014-06-02 HISTORY — DX: Unspecified infectious disease: B99.9

## 2014-06-02 MED ORDER — AZITHROMYCIN 250 MG PO TABS: ORAL_TABLET | ORAL | Status: DC

## 2014-06-02 NOTE — MAU Note (Signed)
Patient states she has had cold like symptoms and bilateral ear pain with drainage for about one week. Getting worse. No fever, no sore throat at this time, cough. Denies abdominal pain, bleeding, leaking, nausea, vomiting or diarrhea. Reports fetal movement.

## 2014-06-02 NOTE — MAU Note (Signed)
Worsening cold, ears now hurting. Never had problems with ears before.

## 2014-06-02 NOTE — Discharge Instructions (Signed)
Upper Respiratory Infection, Adult °An upper respiratory infection (URI) is also sometimes known as the common cold. The upper respiratory tract includes the nose, sinuses, throat, trachea, and bronchi. Bronchi are the airways leading to the lungs. Most people improve within 1 week, but symptoms can last up to 2 weeks. A residual cough may last even longer.  °CAUSES °Many different viruses can infect the tissues lining the upper respiratory tract. The tissues become irritated and inflamed and often become very moist. Mucus production is also common. A cold is contagious. You can easily spread the virus to others by oral contact. This includes kissing, sharing a glass, coughing, or sneezing. Touching your mouth or nose and then touching a surface, which is then touched by another person, can also spread the virus. °SYMPTOMS  °Symptoms typically develop 1 to 3 days after you come in contact with a cold virus. Symptoms vary from person to person. They may include: °· Runny nose. °· Sneezing. °· Nasal congestion. °· Sinus irritation. °· Sore throat. °· Loss of voice (laryngitis). °· Cough. °· Fatigue. °· Muscle aches. °· Loss of appetite. °· Headache. °· Low-grade fever. °DIAGNOSIS  °You might diagnose your own cold based on familiar symptoms, since most people get a cold 2 to 3 times a year. Your caregiver can confirm this based on your exam. Most importantly, your caregiver can check that your symptoms are not due to another disease such as strep throat, sinusitis, pneumonia, asthma, or epiglottitis. Blood tests, throat tests, and X-rays are not necessary to diagnose a common cold, but they may sometimes be helpful in excluding other more serious diseases. Your caregiver will decide if any further tests are required. °RISKS AND COMPLICATIONS  °You may be at risk for a more severe case of the common cold if you smoke cigarettes, have chronic heart disease (such as heart failure) or lung disease (such as asthma), or if  you have a weakened immune system. The very young and very old are also at risk for more serious infections. Bacterial sinusitis, middle ear infections, and bacterial pneumonia can complicate the common cold. The common cold can worsen asthma and chronic obstructive pulmonary disease (COPD). Sometimes, these complications can require emergency medical care and may be life-threatening. °PREVENTION  °The best way to protect against getting a cold is to practice good hygiene. Avoid oral or hand contact with people with cold symptoms. Wash your hands often if contact occurs. There is no clear evidence that vitamin C, vitamin E, echinacea, or exercise reduces the chance of developing a cold. However, it is always recommended to get plenty of rest and practice good nutrition. °TREATMENT  °Treatment is directed at relieving symptoms. There is no cure. Antibiotics are not effective, because the infection is caused by a virus, not by bacteria. Treatment may include: °· Increased fluid intake. Sports drinks offer valuable electrolytes, sugars, and fluids. °· Breathing heated mist or steam (vaporizer or shower). °· Eating chicken soup or other clear broths, and maintaining good nutrition. °· Getting plenty of rest. °· Using gargles or lozenges for comfort. °· Controlling fevers with ibuprofen or acetaminophen as directed by your caregiver. °· Increasing usage of your inhaler if you have asthma. °Zinc gel and zinc lozenges, taken in the first 24 hours of the common cold, can shorten the duration and lessen the severity of symptoms. Pain medicines may help with fever, muscle aches, and throat pain. A variety of non-prescription medicines are available to treat congestion and runny nose. Your caregiver   can make recommendations and may suggest nasal or lung inhalers for other symptoms.  HOME CARE INSTRUCTIONS   Only take over-the-counter or prescription medicines for pain, discomfort, or fever as directed by your  caregiver.  Use a warm mist humidifier or inhale steam from a shower to increase air moisture. This may keep secretions moist and make it easier to breathe.  Drink enough water and fluids to keep your urine clear or pale yellow.  Rest as needed.  Return to work when your temperature has returned to normal or as your caregiver advises. You may need to stay home longer to avoid infecting others. You can also use a face mask and careful hand washing to prevent spread of the virus. SEEK MEDICAL CARE IF:   After the first few days, you feel you are getting worse rather than better.  You need your caregiver's advice about medicines to control symptoms.  You develop chills, worsening shortness of breath, or brown or red sputum. These may be signs of pneumonia.  You develop yellow or brown nasal discharge or pain in the face, especially when you bend forward. These may be signs of sinusitis.  You develop a fever, swollen neck glands, pain with swallowing, or white areas in the back of your throat. These may be signs of strep throat. SEEK IMMEDIATE MEDICAL CARE IF:   You have a fever.  You develop severe or persistent headache, ear pain, sinus pain, or chest pain.  You develop wheezing, a prolonged cough, cough up blood, or have a change in your usual mucus (if you have chronic lung disease).  You develop sore muscles or a stiff neck. Document Released: 12/28/2000 Document Revised: 09/26/2011 [facility] Otitis Media With Effusion Otitis media with effusion is the presence of fluid in the middle ear. This is a common problem in children, which often follows ear infections. It may be present for weeks or longer after the infection. Unlike an acute ear infection, otitis media with effusion refers only to fluid behind the ear drum and not infection. Children with repeated ear and sinus infections and allergy problems are the most likely to get otitis media with effusion. CAUSES  The most frequent  cause of the fluid buildup is dysfunction of the eustachian tubes. These are the tubes that drain fluid in the ears to the back of the nose (nasopharynx). SYMPTOMS   The main symptom of this condition is hearing loss. As a result, you or your child may:  Listen to the TV at a loud volume.  Not respond to questions.  Ask "what" often when spoken to.  Mistake or confuse one sound or word for another.  There may be a sensation of fullness or pressure but usually not pain. DIAGNOSIS   Your health care provider will diagnose this condition by examining you or your child's ears.  Your health care provider may test the pressure in you or your child's ear with a tympanometer.  A hearing test may be conducted if the problem persists. TREATMENT   Treatment depends on the duration and the effects of the effusion.  Antibiotics, decongestants, nose drops, and cortisone-type drugs (tablets or nasal spray) may not be helpful.  Children with persistent ear effusions may have delayed language or behavioral problems. Children at risk for developmental delays in hearing, learning, and speech may require referral to a specialist earlier than children not at risk.  You or your child's health care provider may suggest a referral to an ear, nose, and  throat surgeon for treatment. The following may help restore normal hearing:  Drainage of fluid.  Placement of ear tubes (tympanostomy tubes).  Removal of adenoids (adenoidectomy). HOME CARE INSTRUCTIONS   Avoid secondhand smoke.  Infants who are breastfed are less likely to have this condition.  Avoid feeding infants while they are lying flat.  Avoid known environmental allergens.  Avoid people who are sick. SEEK MEDICAL CARE IF:   Hearing is not better in 3 months.  Hearing is worse.  Ear pain.  Drainage from the ear.  Dizziness. MAKE SURE YOU:   Understand these instructions.  Will watch your condition.  Will get help right  away if you are not doing well or get worse. Document Released: 08/11/2004 Document Revised: 11/18/2013 Document Reviewed: 01/29/2013 [facility] Patient Information 2015 [place]. This information is not intended to replace advice given to you by your health care provider. Make sure you discuss any questions you have with your health care provider. nt Reviewed: 10/09/2013 [facility] Patient Information 2015 [place], [place]. This information is not intended to replace advice given to you by your health care provider. Make sure you discuss any questions you have with your health care provider.

## 2014-06-02 NOTE — MAU Provider Note (Signed)
  History     CSN: [id number]  Arrival date and time: 06/02/14 1515   First Provider Initiated Contact with Patient 06/02/14 1545      Chief Complaint  Patient presents with  . URI  . Otalgia   HPI  Patient is 28 y.o. G2P0010 4740w1d here with complaints of URI.  Patient reports rhinorrhea, headache, cough, ear pain x1wk. Cough was productive of yellow sputum but this stopped.  She comes in today for this because her R ear had drainage and pain for past 2 days and she was feeling dizzy.  +FM, denies LOF, VB, contractions, vaginal discharge.   No past medical history on file.  Past Surgical History  Procedure Laterality Date  . Dilation and curettage of uterus      Family History  Problem Relation Age of Onset  . Cancer Mother   . Diabetes Mother   . Heart disease Mother   . Hyperlipidemia Mother   . Hypertension Mother   . Hyperlipidemia Father   . Hypertension Father   . Diabetes Father     History  Substance Use Topics  . Smoking status: Never Smoker   . Smokeless tobacco: Never Used  . Alcohol Use: No    Allergies:  Allergies  Allergen Reactions  . Shellfish Allergy Itching    Shrimp "hands swollen and mouth gets itchy"     Prescriptions prior to admission  Medication Sig Dispense Refill Last Dose  . Phenyleph-Diphenhyd-GG-APAP (MUCINEX SINUS-MAX DAY/NIGHT PO) Take 1 tablet by mouth 2 (two) times daily as needed (cold symptoms).   06/01/2014 at Unknown time  . Prenatal Vit-Fe Fumarate-FA (PRENATAL COMPLETE) 14-0.4 MG TABS Take 1 tablet by mouth daily. 60 each 0 06/01/2014 at Unknown time    Review of Systems  Constitutional: Negative for fever and chills.  HENT: Positive for congestion, ear discharge and ear pain. Negative for hearing loss and tinnitus.   Eyes: Negative for blurred vision and double vision.  Respiratory: Positive for cough, sputum production and shortness of breath.   Cardiovascular: Negative for chest pain and leg swelling.   Gastrointestinal: Negative for nausea, vomiting and abdominal pain.  Genitourinary: Negative for dysuria and frequency.  Musculoskeletal: Negative for myalgias.  Skin: Negative for rash.  Neurological: Positive for headaches.   Physical Exam   Blood pressure 124/77, pulse 110, temperature 99.2 F (37.3 C), temperature source Oral, resp. rate 18, height 5\' 2"  (1.575 m), weight 93.078 kg (205 lb 3.2 oz), last menstrual period 11/14/2013, SpO2 98 %.  Physical Exam  Constitutional: She is oriented to person, place, and time. She appears well-developed and well-nourished. No distress.  HENT:  Head: Normocephalic and atraumatic.  R TM red, L TM impacted by cerumen  Cardiovascular: Normal rate and regular rhythm.   No murmur heard. Respiratory: Effort normal and breath sounds normal. No respiratory distress.  Musculoskeletal: She exhibits no edema.  Neurological: She is alert and oriented to person, place, and time.  Skin: Skin is warm and dry.    MAU Course  Procedures  MDM NST: reactive, reassuring  Assessment and Plan  Patient is 28 y.o. G2P0010 3940w1d reporting otalgias, headaches, cough, rhinorrhea likely secondary to viral URI and acute R otitis media - fetal kick counts reinforced - preterm labor precautions - Z-pack for otitis media - information on safe medications for URI in pregnancy given to patient  Shirlee LatchBacigalupo, Angela 06/02/2014, 3:54 PM

## 2014-06-18 ENCOUNTER — Encounter: Payer: Medicaid Other | Admitting: Obstetrics and Gynecology

## 2014-06-18 ENCOUNTER — Encounter: Payer: Self-pay | Admitting: Obstetrics and Gynecology

## 2014-07-02 ENCOUNTER — Encounter: Payer: Self-pay | Admitting: Advanced Practice Midwife

## 2014-07-02 ENCOUNTER — Ambulatory Visit (INDEPENDENT_AMBULATORY_CARE_PROVIDER_SITE_OTHER): Payer: Medicaid Other | Admitting: Advanced Practice Midwife

## 2014-07-02 VITALS — BP 106/72 | HR 100 | Temp 97.8°F | Wt 210.0 lb

## 2014-07-02 DIAGNOSIS — Z3403 Encounter for supervision of normal first pregnancy, third trimester: Secondary | ICD-10-CM

## 2014-07-02 DIAGNOSIS — Z23 Encounter for immunization: Secondary | ICD-10-CM

## 2014-07-02 DIAGNOSIS — Z3493 Encounter for supervision of normal pregnancy, unspecified, third trimester: Secondary | ICD-10-CM

## 2014-07-02 LAB — POCT URINALYSIS DIP (DEVICE)
BILIRUBIN URINE: NEGATIVE
GLUCOSE, UA: NEGATIVE mg/dL
Hgb urine dipstick: NEGATIVE
Ketones, ur: NEGATIVE mg/dL
NITRITE: NEGATIVE
Protein, ur: NEGATIVE mg/dL
Specific Gravity, Urine: 1.02 (ref 1.005–1.030)
Urobilinogen, UA: 0.2 mg/dL (ref 0.0–1.0)
pH: 7 (ref 5.0–8.0)

## 2014-07-02 MED ORDER — TETANUS-DIPHTH-ACELL PERTUSSIS 5-2.5-18.5 LF-MCG/0.5 IM SUSP
0.5000 mL | Freq: Once | INTRAMUSCULAR | Status: AC
  Administered 2014-07-02: 0.5 mL via INTRAMUSCULAR

## 2014-07-02 NOTE — Progress Notes (Signed)
28 wk labs, 1 hour gtt,  Tdap vaccine

## 2014-07-02 NOTE — Patient Instructions (Signed)
Breastfeeding Deciding to breastfeed is one of the best choices you can make for you and your baby. A change in hormones during pregnancy causes your breast tissue to grow and increases the number and size of your milk ducts. These hormones also allow proteins, sugars, and fats from your blood supply to make breast milk in your milk-producing glands. Hormones prevent breast milk from being released before your baby is born as well as prompt milk flow after birth. Once breastfeeding has begun, thoughts of your baby, as well as his or her sucking or crying, can stimulate the release of milk from your milk-producing glands.  BENEFITS OF BREASTFEEDING For Your Baby  Your first milk (colostrum) helps your baby's digestive system function better.   There are antibodies in your milk that help your baby fight off infections.   Your baby has a lower incidence of asthma, allergies, and sudden infant death syndrome.   The nutrients in breast milk are better for your baby than infant formulas and are designed uniquely for your baby's needs.   Breast milk improves your baby's brain development.   Your baby is less likely to develop other conditions, such as childhood obesity, asthma, or type 2 diabetes mellitus.  For You   Breastfeeding helps to create a very special bond between you and your baby.   Breastfeeding is convenient. Breast milk is always available at the correct temperature and costs nothing.   Breastfeeding helps to burn calories and helps you lose the weight gained during pregnancy.   Breastfeeding makes your uterus contract to its prepregnancy size faster and slows bleeding (lochia) after you give birth.   Breastfeeding helps to lower your risk of developing type 2 diabetes mellitus, osteoporosis, and breast or ovarian cancer later in life. SIGNS THAT YOUR BABY IS HUNGRY Early Signs of Hunger  Increased alertness or activity.  Stretching.  Movement of the head from  side to side.  Movement of the head and opening of the mouth when the corner of the mouth or cheek is stroked (rooting).  Increased sucking sounds, smacking lips, cooing, sighing, or squeaking.  Hand-to-mouth movements.  Increased sucking of fingers or hands. Late Signs of Hunger  Fussing.  Intermittent crying. Extreme Signs of Hunger Signs of extreme hunger will require calming and consoling before your baby will be able to breastfeed successfully. Do not wait for the following signs of extreme hunger to occur before you initiate breastfeeding:   Restlessness.  A loud, strong cry.   Screaming. BREASTFEEDING BASICS Breastfeeding Initiation  Find a comfortable place to sit or lie down, with your neck and back well supported.  Place a pillow or rolled up blanket under your baby to bring him or her to the level of your breast (if you are seated). Nursing pillows are specially designed to help support your arms and your baby while you breastfeed.  Make sure that your baby's abdomen is facing your abdomen.   Gently massage your breast. With your fingertips, massage from your chest wall toward your nipple in a circular motion. This encourages milk flow. You may need to continue this action during the feeding if your milk flows slowly.  Support your breast with 4 fingers underneath and your thumb above your nipple. Make sure your fingers are well away from your nipple and your baby's mouth.   Stroke your baby's lips gently with your finger or nipple.   When your baby's mouth is open wide enough, quickly bring your baby to your   breast, placing your entire nipple and as much of the colored area around your nipple (areola) as possible into your baby's mouth.   More areola should be visible above your baby's upper lip than below the lower lip.   Your baby's tongue should be between his or her lower gum and your breast.   Ensure that your baby's mouth is correctly positioned  around your nipple (latched). Your baby's lips should create a seal on your breast and be turned out (everted).  It is common for your baby to suck about 2-3 minutes in order to start the flow of breast milk. Latching Teaching your baby how to latch on to your breast properly is very important. An improper latch can cause nipple pain and decreased milk supply for you and poor weight gain in your baby. Also, if your baby is not latched onto your nipple properly, he or she may swallow some air during feeding. This can make your baby fussy. Burping your baby when you switch breasts during the feeding can help to get rid of the air. However, teaching your baby to latch on properly is still the best way to prevent fussiness from swallowing air while breastfeeding. Signs that your baby has successfully latched on to your nipple:    Silent tugging or silent sucking, without causing you pain.   Swallowing heard between every 3-4 sucks.    Muscle movement above and in front of his or her ears while sucking.  Signs that your baby has not successfully latched on to nipple:   Sucking sounds or smacking sounds from your baby while breastfeeding.  Nipple pain. If you think your baby has not latched on correctly, slip your finger into the corner of your baby's mouth to break the suction and place it between your baby's gums. Attempt breastfeeding initiation again. Signs of Successful Breastfeeding Signs from your baby:   A gradual decrease in the number of sucks or complete cessation of sucking.   Falling asleep.   Relaxation of his or her body.   Retention of a small amount of milk in his or her mouth.   Letting go of your breast by himself or herself. Signs from you:  Breasts that have increased in firmness, weight, and size 1-3 hours after feeding.   Breasts that are softer immediately after breastfeeding.  Increased milk volume, as well as a change in milk consistency and color by  the fifth day of breastfeeding.   Nipples that are not sore, cracked, or bleeding. Signs That Your Baby is Getting Enough Milk  Wetting at least 3 diapers in a 24-hour period. The urine should be clear and pale yellow by age 5 days.  At least 3 stools in a 24-hour period by age 5 days. The stool should be soft and yellow.  At least 3 stools in a 24-hour period by age 7 days. The stool should be seedy and yellow.  No loss of weight greater than 10% of birth weight during the first 3 days of age.  Average weight gain of 4-7 ounces (113-198 g) per week after age 4 days.  Consistent daily weight gain by age 5 days, without weight loss after the age of 2 weeks. After a feeding, your baby may spit up a small amount. This is common. BREASTFEEDING FREQUENCY AND DURATION Frequent feeding will help you make more milk and can prevent sore nipples and breast engorgement. Breastfeed when you feel the need to reduce the fullness of your breasts   or when your baby shows signs of hunger. This is called "breastfeeding on demand." Avoid introducing a pacifier to your baby while you are working to establish breastfeeding (the first 4-6 weeks after your baby is born). After this time you may choose to use a pacifier. Research has shown that pacifier use during the first year of a baby's life decreases the risk of sudden infant death syndrome (SIDS). Allow your baby to feed on each breast as long as he or she wants. Breastfeed until your baby is finished feeding. When your baby unlatches or falls asleep while feeding from the first breast, offer the second breast. Because newborns are often sleepy in the first few weeks of life, you may need to awaken your baby to get him or her to feed. Breastfeeding times will vary from baby to baby. However, the following rules can serve as a guide to help you ensure that your baby is properly fed:  Newborns (babies 4 weeks of age or younger) may breastfeed every 1-3  hours.  Newborns should not go longer than 3 hours during the day or 5 hours during the night without breastfeeding.  You should breastfeed your baby a minimum of 8 times in a 24-hour period until you begin to introduce solid foods to your baby at around 6 months of age. BREAST MILK PUMPING Pumping and storing breast milk allows you to ensure that your baby is exclusively fed your breast milk, even at times when you are unable to breastfeed. This is especially important if you are going back to work while you are still breastfeeding or when you are not able to be present during feedings. Your lactation consultant can give you guidelines on how long it is safe to store breast milk.  A breast pump is a machine that allows you to pump milk from your breast into a sterile bottle. The pumped breast milk can then be stored in a refrigerator or freezer. Some breast pumps are operated by hand, while others use electricity. Ask your lactation consultant which type will work best for you. Breast pumps can be purchased, but some hospitals and breastfeeding support groups lease breast pumps on a monthly basis. A lactation consultant can teach you how to hand express breast milk, if you prefer not to use a pump.  CARING FOR YOUR BREASTS WHILE YOU BREASTFEED Nipples can become dry, cracked, and sore while breastfeeding. The following recommendations can help keep your breasts moisturized and healthy:  Avoid using soap on your nipples.   Wear a supportive bra. Although not required, special nursing bras and tank tops are designed to allow access to your breasts for breastfeeding without taking off your entire bra or top. Avoid wearing underwire-style bras or extremely tight bras.  Air dry your nipples for 3-4minutes after each feeding.   Use only cotton bra pads to absorb leaked breast milk. Leaking of breast milk between feedings is normal.   Use lanolin on your nipples after breastfeeding. Lanolin helps to  maintain your skin's normal moisture barrier. If you use pure lanolin, you do not need to wash it off before feeding your baby again. Pure lanolin is not toxic to your baby. You may also hand express a few drops of breast milk and gently massage that milk into your nipples and allow the milk to air dry. In the first few weeks after giving birth, some women experience extremely full breasts (engorgement). Engorgement can make your breasts feel heavy, warm, and tender to the   touch. Engorgement peaks within 3-5 days after you give birth. The following recommendations can help ease engorgement:  Completely empty your breasts while breastfeeding or pumping. You may want to start by applying warm, moist heat (in the shower or with warm water-soaked hand towels) just before feeding or pumping. This increases circulation and helps the milk flow. If your baby does not completely empty your breasts while breastfeeding, pump any extra milk after he or she is finished.  Wear a snug bra (nursing or regular) or tank top for 1-2 days to signal your body to slightly decrease milk production.  Apply ice packs to your breasts, unless this is too uncomfortable for you.  Make sure that your baby is latched on and positioned properly while breastfeeding. If engorgement persists after 48 hours of following these recommendations, contact your health care provider or a lactation consultant. OVERALL HEALTH CARE RECOMMENDATIONS WHILE BREASTFEEDING  Eat healthy foods. Alternate between meals and snacks, eating 3 of each per day. Because what you eat affects your breast milk, some of the foods may make your baby more irritable than usual. Avoid eating these foods if you are sure that they are negatively affecting your baby.  Drink milk, fruit juice, and water to satisfy your thirst (about 10 glasses a day).   Rest often, relax, and continue to take your prenatal vitamins to prevent fatigue, stress, and anemia.  Continue  breast self-awareness checks.  Avoid chewing and smoking tobacco.  Avoid alcohol and drug use. Some medicines that may be harmful to your baby can pass through breast milk. It is important to ask your health care provider before taking any medicine, including all over-the-counter and prescription medicine as well as vitamin and herbal supplements. It is possible to become pregnant while breastfeeding. If birth control is desired, ask your health care provider about options that will be safe for your baby. SEEK MEDICAL CARE IF:   You feel like you want to stop breastfeeding or have become frustrated with breastfeeding.  You have painful breasts or nipples.  Your nipples are cracked or bleeding.  Your breasts are red, tender, or warm.  You have a swollen area on either breast.  You have a fever or chills.  You have nausea or vomiting.  You have drainage other than breast milk from your nipples.  Your breasts do not become full before feedings by the fifth day after you give birth.  You feel sad and depressed.  Your baby is too sleepy to eat well.  Your baby is having trouble sleeping.   Your baby is wetting less than 3 diapers in a 24-hour period.  Your baby has less than 3 stools in a 24-hour period.  Your baby's skin or the white part of his or her eyes becomes yellow.   Your baby is not gaining weight by 5 days of age. SEEK IMMEDIATE MEDICAL CARE IF:   Your baby is overly tired (lethargic) and does not want to wake up and feed.  Your baby develops an unexplained fever. Document Released: 07/04/2005 Document Revised: 07/09/2013 Document Reviewed: 12/26/2012 [employer] Patient Information 2015 [employer]. This information is not intended to replace advice given to you by your health care provider. Make sure you discuss any questions you have with your health care provider.  Preterm Labor Information Preterm labor is when labor starts at less than 37 weeks of  pregnancy. The normal length of a pregnancy is 39 to 41 weeks. CAUSES Often, there is no identifiable   underlying cause as to why a woman goes into preterm labor. One of the most common known causes of preterm labor is infection. Infections of the uterus, cervix, vagina, amniotic sac, bladder, kidney, or even the lungs (pneumonia) can cause labor to start. Other suspected causes of preterm labor include:   Urogenital infections, such as yeast infections and bacterial vaginosis.   Uterine abnormalities (uterine shape, uterine septum, fibroids, or bleeding from the placenta).   A cervix that has been operated on (it may fail to stay closed).   Malformations in the fetus.   Multiple gestations (twins, triplets, and so on).   Breakage of the amniotic sac.  RISK FACTORS  Having a previous history of preterm labor.   Having premature rupture of membranes (PROM).   Having a placenta that covers the opening of the cervix (placenta previa).   Having a placenta that separates from the uterus (placental abruption).   Having a cervix that is too weak to hold the fetus in the uterus (incompetent cervix).   Having too much fluid in the amniotic sac (polyhydramnios).   Taking illegal drugs or smoking while pregnant.   Not gaining enough weight while pregnant.   Being younger than 18 and older than 26 years old.   Having a low socioeconomic status.   Being African American. SYMPTOMS Signs and symptoms of preterm labor include:   Menstrual-like cramps, abdominal pain, or back pain.  Uterine contractions that are regular, as frequent as six in an hour, regardless of their intensity (may be mild or painful).  Contractions that start on the top of the uterus and spread down to the lower abdomen and back.   A sense of increased pelvic pressure.   A watery or bloody mucus discharge that comes from the vagina.  TREATMENT Depending on the length of the pregnancy and other  circumstances, your health care provider may suggest bed rest. If necessary, there are medicines that can be given to stop contractions and to mature the fetal lungs. If labor happens before 34 weeks of pregnancy, a prolonged hospital stay may be recommended. Treatment depends on the condition of both you and the fetus.  WHAT SHOULD YOU DO IF YOU THINK YOU ARE IN PRETERM LABOR? Call your health care provider right away. You will need to go to the hospital to get checked immediately. HOW CAN YOU PREVENT PRETERM LABOR IN FUTURE PREGNANCIES? You should:   Stop smoking if you smoke.  Maintain healthy weight gain and avoid chemicals and drugs that are not necessary.  Be watchful for any type of infection.  Inform your health care provider if you have a known history of preterm labor. Document Released: 09/24/2003 Document Revised: 03/06/2013 Document Reviewed: 08/06/2012 [employer] Patient Information 2015 [employer]. This information is not intended to replace advice given to you by your health care provider. Make sure you discuss any questions you have with your health care provider.  

## 2014-07-02 NOTE — Progress Notes (Signed)
28 week labs, TDaP. Enc CBE.

## 2014-07-03 LAB — CBC
HEMATOCRIT: 35.2 % — AB (ref 36.0–46.0)
HEMOGLOBIN: 11.8 g/dL — AB (ref 12.0–15.0)
MCH: 29.3 pg (ref 26.0–34.0)
MCHC: 33.5 g/dL (ref 30.0–36.0)
MCV: 87.3 fL (ref 78.0–100.0)
MPV: 9.9 fL (ref 9.4–12.4)
Platelets: 290 10*3/uL (ref 150–400)
RBC: 4.03 MIL/uL (ref 3.87–5.11)
RDW: 13.8 % (ref 11.5–15.5)
WBC: 9.6 10*3/uL (ref 4.0–10.5)

## 2014-07-03 LAB — GLUCOSE TOLERANCE, 1 HOUR (50G) W/O FASTING: GLUCOSE 1 HOUR GTT: 104 mg/dL (ref 70–140)

## 2014-07-03 LAB — HIV ANTIBODY (ROUTINE TESTING W REFLEX): HIV: NONREACTIVE

## 2014-07-03 LAB — RPR

## 2014-07-16 ENCOUNTER — Encounter: Payer: Medicaid Other | Admitting: Obstetrics and Gynecology

## 2014-07-16 ENCOUNTER — Encounter: Payer: Self-pay | Admitting: Obstetrics and Gynecology

## 2014-07-18 NOTE — L&D Delivery Note (Signed)
Delivery Note At 9:01 AM a viable female was delivered via Vaginal, Spontaneous Delivery (Presentation: Cephalic, OA>ROA ).  APGAR: 9, 9; weight  .   Placenta status: Intact, Spontaneous.  Cord: 3 vessels with the following complications: None.    Anesthesia: None  Episiotomy: None Lacerations: None Suture Repair: n/a Est. Blood Loss (mL): 250  Mom to postpartum.  Baby to Couplet care / Skin to Skin.  Jennifer Payes 09/03/2014, 9:21 AM

## 2014-08-05 ENCOUNTER — Ambulatory Visit (INDEPENDENT_AMBULATORY_CARE_PROVIDER_SITE_OTHER): Payer: Medicaid Other | Admitting: Obstetrics & Gynecology

## 2014-08-05 ENCOUNTER — Encounter: Payer: Self-pay | Admitting: General Practice

## 2014-08-05 ENCOUNTER — Encounter: Payer: Self-pay | Admitting: Obstetrics & Gynecology

## 2014-08-05 VITALS — BP 113/76 | HR 67 | Temp 97.7°F | Wt 221.5 lb

## 2014-08-05 DIAGNOSIS — Z3483 Encounter for supervision of other normal pregnancy, third trimester: Secondary | ICD-10-CM

## 2014-08-05 LAB — POCT URINALYSIS DIP (DEVICE)
Bilirubin Urine: NEGATIVE
Glucose, UA: NEGATIVE mg/dL
Hgb urine dipstick: NEGATIVE
Ketones, ur: NEGATIVE mg/dL
Nitrite: NEGATIVE
Protein, ur: NEGATIVE mg/dL
Specific Gravity, Urine: 1.015 (ref 1.005–1.030)
Urobilinogen, UA: 0.2 mg/dL (ref 0.0–1.0)
pH: 7 (ref 5.0–8.0)

## 2014-08-05 NOTE — Patient Instructions (Addendum)
Third Trimester of Pregnancy The third trimester is from week 29 through week 42, months 7 through 9. The third trimester is a time when the fetus is growing rapidly. At the end of the ninth month, the fetus is about 20 inches in length and weighs 6-10 pounds.  BODY CHANGES Your body goes through many changes during pregnancy. The changes vary from woman to woman.   Your weight will continue to increase. You can expect to gain 25-35 pounds (11-16 kg) by the end of the pregnancy.  You may begin to get stretch marks on your hips, abdomen, and breasts.  You may urinate more often because the fetus is moving lower into your pelvis and pressing on your bladder.  You may develop or continue to have heartburn as a result of your pregnancy.  You may develop constipation because certain hormones are causing the muscles that push waste through your intestines to slow down.  You may develop hemorrhoids or swollen, bulging veins (varicose veins).  You may have pelvic pain because of the weight gain and pregnancy hormones relaxing your joints between the bones in your pelvis. Backaches may result from overexertion of the muscles supporting your posture.  You may have changes in your hair. These can include thickening of your hair, rapid growth, and changes in texture. Some women also have hair loss during or after pregnancy, or hair that feels dry or thin. Your hair will most likely return to normal after your baby is born.  Your breasts will continue to grow and be tender. A yellow discharge may leak from your breasts called colostrum.  Your belly button may stick out.  You may feel short of breath because of your expanding uterus.  You may notice the fetus "dropping," or moving lower in your abdomen.  You may have a bloody mucus discharge. This usually occurs a few days to a week before labor begins.  Your cervix becomes thin and soft (effaced) near your due date. WHAT TO EXPECT AT YOUR PRENATAL  EXAMS  You will have prenatal exams every 2 weeks until week 36. Then, you will have weekly prenatal exams. During a routine prenatal visit:  You will be weighed to make sure you and the fetus are growing normally.  Your blood pressure is taken.  Your abdomen will be measured to track your baby's growth.  The fetal heartbeat will be listened to.  Any test results from the previous visit will be discussed.  You may have a cervical check near your due date to see if you have effaced. At around 36 weeks, your caregiver will check your cervix. At the same time, your caregiver will also perform a test on the secretions of the vaginal tissue. This test is to determine if a type of bacteria, Group B streptococcus, is present. Your caregiver will explain this further. Your caregiver may ask you:  What your birth plan is.  How you are feeling.  If you are feeling the baby move.  If you have had any abnormal symptoms, such as leaking fluid, bleeding, severe headaches, or abdominal cramping.  If you have any questions. Other tests or screenings that may be performed during your third trimester include:  Blood tests that check for low iron levels (anemia).  Fetal testing to check the health, activity level, and growth of the fetus. Testing is done if you have certain medical conditions or if there are problems during the pregnancy. FALSE LABOR You may feel small, irregular contractions that   eventually go away. These are called Braxton Hicks contractions, or false labor. Contractions may last for hours, days, or even weeks before true labor sets in. If contractions come at regular intervals, intensify, or become painful, it is best to be seen by your caregiver.  SIGNS OF LABOR   Menstrual-like cramps.  Contractions that are 5 minutes apart or less.  Contractions that start on the top of the uterus and spread down to the lower abdomen and back.  A sense of increased pelvic pressure or back  pain.  A watery or bloody mucus discharge that comes from the vagina. If you have any of these signs before the 37th week of pregnancy, call your caregiver right away. You need to go to the hospital to get checked immediately. HOME CARE INSTRUCTIONS   Avoid all smoking, herbs, alcohol, and unprescribed drugs. These chemicals affect the formation and growth of the baby.  Follow your caregiver's instructions regarding medicine use. There are medicines that are either safe or unsafe to take during pregnancy.  Exercise only as directed by your caregiver. Experiencing uterine cramps is a good sign to stop exercising.  Continue to eat regular, healthy meals.  Wear a good support bra for breast tenderness.  Do not use hot tubs, steam rooms, or saunas.  Wear your seat belt at all times when driving.  Avoid raw meat, uncooked cheese, cat litter boxes, and soil used by cats. These carry germs that can cause birth defects in the baby.  Take your prenatal vitamins.  Try taking a stool softener (if your caregiver approves) if you develop constipation. Eat more high-fiber foods, such as fresh vegetables or fruit and whole grains. Drink plenty of fluids to keep your urine clear or pale yellow.  Take warm sitz baths to soothe any pain or discomfort caused by hemorrhoids. Use hemorrhoid cream if your caregiver approves.  If you develop varicose veins, wear support hose. Elevate your feet for 15 minutes, 3-4 times a day. Limit salt in your diet.  Avoid heavy lifting, wear low heal shoes, and practice good posture.  Rest a lot with your legs elevated if you have leg cramps or low back pain.  Visit your dentist if you have not gone during your pregnancy. Use a soft toothbrush to brush your teeth and be gentle when you floss.  A sexual relationship may be continued unless your caregiver directs you otherwise.  Do not travel far distances unless it is absolutely necessary and only with the approval  of your caregiver.  Take prenatal classes to understand, practice, and ask questions about the labor and delivery.  Make a trial run to the hospital.  Pack your hospital bag.  Prepare the baby's nursery.  Continue to go to all your prenatal visits as directed by your caregiver. SEEK MEDICAL CARE IF:  You are unsure if you are in labor or if your water has broken.  You have dizziness.  You have mild pelvic cramps, pelvic pressure, or nagging pain in your abdominal area.  You have persistent nausea, vomiting, or diarrhea.  You have a bad smelling vaginal discharge.  You have pain with urination. SEEK IMMEDIATE MEDICAL CARE IF:   You have a fever.  You are leaking fluid from your vagina.  You have spotting or bleeding from your vagina.  You have severe abdominal cramping or pain.  You have rapid weight loss or gain.  You have shortness of breath with chest pain.  You notice sudden or extreme swelling   of your face, hands, ankles, feet, or legs.  You have not felt your baby move in over an hour.  You have severe headaches that do not go away with medicine.  You have vision changes. Document Released: 06/28/2001 Document Revised: 07/09/2013 Document Reviewed: 09/04/2012 [employer] Patient Information 2015 [employer]. This information is not intended to replace advice given to you by your health care provider. Make sure you discuss any questions you have with your health care provider. Etonogestrel implant What is this medicine? ETONOGESTREL (et oh noe JES trel) is a contraceptive (birth control) device. It is used to prevent pregnancy. It can be used for up to 3 years. This medicine may be used for other purposes; ask your health care provider or pharmacist if you have questions. COMMON BRAND NAME(S): Implanon, Nexplanon What should I tell my health care provider before I take this medicine? They need to know if you have any of these conditions: -abnormal vaginal  bleeding -blood vessel disease or blood clots -cancer of the breast, cervix, or liver -depression -diabetes -gallbladder disease -headaches -heart disease or recent heart attack -high blood pressure -high cholesterol -kidney disease -liver disease -renal disease -seizures -tobacco smoker -an unusual or allergic reaction to etonogestrel, other hormones, anesthetics or antiseptics, medicines, foods, dyes, or preservatives -pregnant or trying to get pregnant -breast-feeding How should I use this medicine? This device is inserted just under the skin on the inner side of your upper arm by a health care professional. Talk to your pediatrician regarding the use of this medicine in children. Special care may be needed. Overdosage: If you think you've taken too much of this medicine contact a poison control center or emergency room at once. Overdosage: If you think you have taken too much of this medicine contact a poison control center or emergency room at once. NOTE: This medicine is only for you. Do not share this medicine with others. What if I miss a dose? This does not apply. What may interact with this medicine? Do not take this medicine with any of the following medications: -amprenavir -bosentan -fosamprenavir This medicine may also interact with the following medications: -barbiturate medicines for inducing sleep or treating seizures -certain medicines for fungal infections like ketoconazole and itraconazole -griseofulvin -medicines to treat seizures like carbamazepine, felbamate, oxcarbazepine, phenytoin, topiramate -modafinil -phenylbutazone -rifampin -some medicines to treat HIV infection like atazanavir, indinavir, lopinavir, nelfinavir, tipranavir, ritonavir -St. John's wort This list may not describe all possible interactions. Give your health care provider a list of all the medicines, herbs, non-prescription drugs, or dietary supplements you use. Also tell them if you  smoke, drink alcohol, or use illegal drugs. Some items may interact with your medicine. What should I watch for while using this medicine? This product does not protect you against HIV infection (AIDS) or other sexually transmitted diseases. You should be able to feel the implant by pressing your fingertips over the skin where it was inserted. Tell your doctor if you cannot feel the implant. What side effects may I notice from receiving this medicine? Side effects that you should report to your doctor or health care professional as soon as possible: -allergic reactions like skin rash, itching or hives, swelling of the face, lips, or tongue -breast lumps -changes in vision -confusion, trouble speaking or understanding -dark urine -depressed mood -general ill feeling or flu-like symptoms -light-colored stools -loss of appetite, nausea -right upper belly pain -severe headaches -severe pain, swelling, or tenderness in the abdomen -shortness of breath, chest   pain, swelling in a leg -signs of pregnancy -sudden numbness or weakness of the face, arm or leg -trouble walking, dizziness, loss of balance or coordination -unusual vaginal bleeding, discharge -unusually weak or tired -yellowing of the eyes or skin Side effects that usually do not require medical attention (Report these to your doctor or health care professional if they continue or are bothersome.): -acne -breast pain -changes in weight -cough -fever or chills -headache -irregular menstrual bleeding -itching, burning, and vaginal discharge -pain or difficulty passing urine -sore throat This list may not describe all possible side effects. Call your doctor for medical advice about side effects. You may report side effects to FDA at 1-800-FDA-1088. Where should I keep my medicine? This drug is given in a hospital or clinic and will not be stored at home. NOTE: This sheet is a summary. It may not cover all possible information.  If you have questions about this medicine, talk to your doctor, pharmacist, or health care provider.  2015, [employer]. (2012-01-09 15:37:45) Levonorgestrel intrauterine device (IUD) What is this medicine? LEVONORGESTREL IUD (LEE voe nor jes trel) is a contraceptive (birth control) device. The device is placed inside the uterus by a healthcare professional. It is used to prevent pregnancy and can also be used to treat heavy bleeding that occurs during your period. Depending on the device, it can be used for 3 to 5 years. This medicine may be used for other purposes; ask your health care provider or pharmacist if you have questions. COMMON BRAND NAME(S): Elveria Royals What should I tell my health care provider before I take this medicine? They need to know if you have any of these conditions: -abnormal Pap smear -cancer of the breast, uterus, or cervix -diabetes -endometritis -genital or pelvic infection now or in the past -have more than one sexual partner or your partner has more than one partner -heart disease -history of an ectopic or tubal pregnancy -immune system problems -IUD in place -liver disease or tumor -problems with blood clots or take blood-thinners -use intravenous drugs -uterus of unusual shape -vaginal bleeding that has not been explained -an unusual or allergic reaction to levonorgestrel, other hormones, silicone, or polyethylene, medicines, foods, dyes, or preservatives -pregnant or trying to get pregnant -breast-feeding How should I use this medicine? This device is placed inside the uterus by a health care professional. Talk to your pediatrician regarding the use of this medicine in children. Special care may be needed. Overdosage: If you think you have taken too much of this medicine contact a poison control center or emergency room at once. NOTE: This medicine is only for you. Do not share this medicine with others. What if I miss a  dose? This does not apply. What may interact with this medicine? Do not take this medicine with any of the following medications: -amprenavir -bosentan -fosamprenavir This medicine may also interact with the following medications: -aprepitant -barbiturate medicines for inducing sleep or treating seizures -bexarotene -griseofulvin -medicines to treat seizures like carbamazepine, ethotoin, felbamate, oxcarbazepine, phenytoin, topiramate -modafinil -pioglitazone -rifabutin -rifampin -rifapentine -some medicines to treat HIV infection like atazanavir, indinavir, lopinavir, nelfinavir, tipranavir, ritonavir -St. John's wort -warfarin This list may not describe all possible interactions. Give your health care provider a list of all the medicines, herbs, non-prescription drugs, or dietary supplements you use. Also tell them if you smoke, drink alcohol, or use illegal drugs. Some items may interact with your medicine. What should I watch for while using this medicine? Visit your  doctor or health care professional for regular check ups. See your doctor if you or your partner has sexual contact with others, becomes HIV positive, or gets a sexual transmitted disease. This product does not protect you against HIV infection (AIDS) or other sexually transmitted diseases. You can check the placement of the IUD yourself by reaching up to the top of your vagina with clean fingers to feel the threads. Do not pull on the threads. It is a good habit to check placement after each menstrual period. Call your doctor right away if you feel more of the IUD than just the threads or if you cannot feel the threads at all. The IUD may come out by itself. You may become pregnant if the device comes out. If you notice that the IUD has come out use a backup birth control method like condoms and call your health care provider. Using tampons will not change the position of the IUD and are okay to use during your  period. What side effects may I notice from receiving this medicine? Side effects that you should report to your doctor or health care professional as soon as possible: -allergic reactions like skin rash, itching or hives, swelling of the face, lips, or tongue -fever, flu-like symptoms -genital sores -high blood pressure -no menstrual period for 6 weeks during use -pain, swelling, warmth in the leg -pelvic pain or tenderness -severe or sudden headache -signs of pregnancy -stomach cramping -sudden shortness of breath -trouble with balance, talking, or walking -unusual vaginal bleeding, discharge -yellowing of the eyes or skin Side effects that usually do not require medical attention (report to your doctor or health care professional if they continue or are bothersome): -acne -breast pain -change in sex drive or performance -changes in weight -cramping, dizziness, or faintness while the device is being inserted -headache -irregular menstrual bleeding within first 3 to 6 months of use -nausea This list may not describe all possible side effects. Call your doctor for medical advice about side effects. You may report side effects to FDA at 1-800-FDA-1088. Where should I keep my medicine? This does not apply. NOTE: This sheet is a summary. It may not cover all possible information. If you have questions about this medicine, talk to your doctor, pharmacist, or health care provider.  2015, [employer]. (2011-08-04 13:54:04)

## 2014-08-05 NOTE — Progress Notes (Signed)
Pt denies ctx. Feels some cramps occ.  No LOF or VB GBS and cx on next visit Reviewed contraception- pt undecided info given.

## 2014-08-13 ENCOUNTER — Encounter: Payer: Self-pay | Admitting: Advanced Practice Midwife

## 2014-08-13 ENCOUNTER — Encounter (HOSPITAL_COMMUNITY): Payer: Self-pay | Admitting: Advanced Practice Midwife

## 2014-08-13 ENCOUNTER — Ambulatory Visit (INDEPENDENT_AMBULATORY_CARE_PROVIDER_SITE_OTHER): Payer: Medicaid Other | Admitting: Advanced Practice Midwife

## 2014-08-13 VITALS — BP 117/85 | HR 113 | Temp 97.9°F | Wt 222.2 lb

## 2014-08-13 DIAGNOSIS — Z3483 Encounter for supervision of other normal pregnancy, third trimester: Secondary | ICD-10-CM

## 2014-08-13 LAB — POCT URINALYSIS DIP (DEVICE)
Bilirubin Urine: NEGATIVE
Glucose, UA: NEGATIVE mg/dL
HGB URINE DIPSTICK: NEGATIVE
KETONES UR: NEGATIVE mg/dL
NITRITE: NEGATIVE
Protein, ur: NEGATIVE mg/dL
Specific Gravity, Urine: 1.02 (ref 1.005–1.030)
UROBILINOGEN UA: 0.2 mg/dL (ref 0.0–1.0)
pH: 6.5 (ref 5.0–8.0)

## 2014-08-13 LAB — OB RESULTS CONSOLE GBS: GBS: NEGATIVE

## 2014-08-13 LAB — OB RESULTS CONSOLE GC/CHLAMYDIA
Chlamydia: NEGATIVE
Gonorrhea: NEGATIVE

## 2014-08-13 NOTE — Progress Notes (Signed)
Doing well. Cultures done 

## 2014-08-13 NOTE — Progress Notes (Signed)
C/o last few weeks feels like is leaking urine. States when she urinates a few minutes later feels like urine leaking- goes to restroom again and urinates.

## 2014-08-13 NOTE — Patient Instructions (Signed)
Third Trimester of Pregnancy The third trimester is from week 29 through week 42, months 7 through 9. The third trimester is a time when the fetus is growing rapidly. At the end of the ninth month, the fetus is about 20 inches in length and weighs 6-10 pounds.  BODY CHANGES Your body goes through many changes during pregnancy. The changes vary from woman to woman.   Your weight will continue to increase. You can expect to gain 25-35 pounds (11-16 kg) by the end of the pregnancy.  You may begin to get stretch marks on your hips, abdomen, and breasts.  You may urinate more often because the fetus is moving lower into your pelvis and pressing on your bladder.  You may develop or continue to have heartburn as a result of your pregnancy.  You may develop constipation because certain hormones are causing the muscles that push waste through your intestines to slow down.  You may develop hemorrhoids or swollen, bulging veins (varicose veins).  You may have pelvic pain because of the weight gain and pregnancy hormones relaxing your joints between the bones in your pelvis. Backaches may result from overexertion of the muscles supporting your posture.  You may have changes in your hair. These can include thickening of your hair, rapid growth, and changes in texture. Some women also have hair loss during or after pregnancy, or hair that feels dry or thin. Your hair will most likely return to normal after your baby is born.  Your breasts will continue to grow and be tender. A yellow discharge may leak from your breasts called colostrum.  Your belly button may stick out.  You may feel short of breath because of your expanding uterus.  You may notice the fetus "dropping," or moving lower in your abdomen.  You may have a bloody mucus discharge. This usually occurs a few days to a week before labor begins.  Your cervix becomes thin and soft (effaced) near your due date. WHAT TO EXPECT AT YOUR PRENATAL  EXAMS  You will have prenatal exams every 2 weeks until week 36. Then, you will have weekly prenatal exams. During a routine prenatal visit:  You will be weighed to make sure you and the fetus are growing normally.  Your blood pressure is taken.  Your abdomen will be measured to track your baby's growth.  The fetal heartbeat will be listened to.  Any test results from the previous visit will be discussed.  You may have a cervical check near your due date to see if you have effaced. At around 36 weeks, your caregiver will check your cervix. At the same time, your caregiver will also perform a test on the secretions of the vaginal tissue. This test is to determine if a type of bacteria, Group B streptococcus, is present. Your caregiver will explain this further. Your caregiver may ask you:  What your birth plan is.  How you are feeling.  If you are feeling the baby move.  If you have had any abnormal symptoms, such as leaking fluid, bleeding, severe headaches, or abdominal cramping.  If you have any questions. Other tests or screenings that may be performed during your third trimester include:  Blood tests that check for low iron levels (anemia).  Fetal testing to check the health, activity level, and growth of the fetus. Testing is done if you have certain medical conditions or if there are problems during the pregnancy. FALSE LABOR You may feel small, irregular contractions that   eventually go away. These are called Braxton Hicks contractions, or false labor. Contractions may last for hours, days, or even weeks before true labor sets in. If contractions come at regular intervals, intensify, or become painful, it is best to be seen by your caregiver.  SIGNS OF LABOR   Menstrual-like cramps.  Contractions that are 5 minutes apart or less.  Contractions that start on the top of the uterus and spread down to the lower abdomen and back.  A sense of increased pelvic pressure or back  pain.  A watery or bloody mucus discharge that comes from the vagina. If you have any of these signs before the 37th week of pregnancy, call your caregiver right away. You need to go to the hospital to get checked immediately. HOME CARE INSTRUCTIONS   Avoid all smoking, herbs, alcohol, and unprescribed drugs. These chemicals affect the formation and growth of the baby.  Follow your caregiver's instructions regarding medicine use. There are medicines that are either safe or unsafe to take during pregnancy.  Exercise only as directed by your caregiver. Experiencing uterine cramps is a good sign to stop exercising.  Continue to eat regular, healthy meals.  Wear a good support bra for breast tenderness.  Do not use hot tubs, steam rooms, or saunas.  Wear your seat belt at all times when driving.  Avoid raw meat, uncooked cheese, cat litter boxes, and soil used by cats. These carry germs that can cause birth defects in the baby.  Take your prenatal vitamins.  Try taking a stool softener (if your caregiver approves) if you develop constipation. Eat more high-fiber foods, such as fresh vegetables or fruit and whole grains. Drink plenty of fluids to keep your urine clear or pale yellow.  Take warm sitz baths to soothe any pain or discomfort caused by hemorrhoids. Use hemorrhoid cream if your caregiver approves.  If you develop varicose veins, wear support hose. Elevate your feet for 15 minutes, 3-4 times a day. Limit salt in your diet.  Avoid heavy lifting, wear low heal shoes, and practice good posture.  Rest a lot with your legs elevated if you have leg cramps or low back pain.  Visit your dentist if you have not gone during your pregnancy. Use a soft toothbrush to brush your teeth and be gentle when you floss.  A sexual relationship may be continued unless your caregiver directs you otherwise.  Do not travel far distances unless it is absolutely necessary and only with the approval  of your caregiver.  Take prenatal classes to understand, practice, and ask questions about the labor and delivery.  Make a trial run to the hospital.  Pack your hospital bag.  Prepare the baby's nursery.  Continue to go to all your prenatal visits as directed by your caregiver. SEEK MEDICAL CARE IF:  You are unsure if you are in labor or if your water has broken.  You have dizziness.  You have mild pelvic cramps, pelvic pressure, or nagging pain in your abdominal area.  You have persistent nausea, vomiting, or diarrhea.  You have a bad smelling vaginal discharge.  You have pain with urination. SEEK IMMEDIATE MEDICAL CARE IF:   You have a fever.  You are leaking fluid from your vagina.  You have spotting or bleeding from your vagina.  You have severe abdominal cramping or pain.  You have rapid weight loss or gain.  You have shortness of breath with chest pain.  You notice sudden or extreme swelling   of your face, hands, ankles, feet, or legs.  You have not felt your baby move in over an hour.  You have severe headaches that do not go away with medicine.  You have vision changes. Document Released: 06/28/2001 Document Revised: 07/09/2013 Document Reviewed: 09/04/2012 Exit[employer] Patient Information 2015 [employer]. This information is not intended to replace advice given to you by your health care provider. Make sure you discuss any questions you have with your health care provider.  

## 2014-08-14 LAB — GC/CHLAMYDIA PROBE AMP
CT Probe RNA: NEGATIVE
GC Probe RNA: NEGATIVE

## 2014-08-15 LAB — CULTURE, BETA STREP (GROUP B ONLY)

## 2014-08-22 ENCOUNTER — Ambulatory Visit (INDEPENDENT_AMBULATORY_CARE_PROVIDER_SITE_OTHER): Payer: Medicaid Other | Admitting: Family

## 2014-08-22 VITALS — BP 125/80 | HR 80 | Temp 97.4°F | Wt 223.4 lb

## 2014-08-22 DIAGNOSIS — Z3403 Encounter for supervision of normal first pregnancy, third trimester: Secondary | ICD-10-CM

## 2014-08-22 LAB — POCT URINALYSIS DIP (DEVICE)
BILIRUBIN URINE: NEGATIVE
Glucose, UA: NEGATIVE mg/dL
Hgb urine dipstick: NEGATIVE
KETONES UR: NEGATIVE mg/dL
Nitrite: NEGATIVE
Protein, ur: NEGATIVE mg/dL
UROBILINOGEN UA: 1 mg/dL (ref 0.0–1.0)
pH: 6.5 (ref 5.0–8.0)

## 2014-08-22 NOTE — Progress Notes (Signed)
C/o of pelvic pressure but denies pain and contractions.  

## 2014-08-22 NOTE — Progress Notes (Signed)
Reviewed GBS and GC/CT results.  Declined cervical check.  Reviewed signs of labor.

## 2014-08-29 ENCOUNTER — Ambulatory Visit (INDEPENDENT_AMBULATORY_CARE_PROVIDER_SITE_OTHER): Payer: Medicaid Other | Admitting: Advanced Practice Midwife

## 2014-08-29 ENCOUNTER — Encounter: Payer: Self-pay | Admitting: Advanced Practice Midwife

## 2014-08-29 VITALS — BP 132/86 | HR 76 | Temp 97.3°F | Wt 224.1 lb

## 2014-08-29 DIAGNOSIS — O1203 Gestational edema, third trimester: Secondary | ICD-10-CM | POA: Insufficient documentation

## 2014-08-29 LAB — POCT URINALYSIS DIP (DEVICE)
BILIRUBIN URINE: NEGATIVE
Glucose, UA: NEGATIVE mg/dL
Hgb urine dipstick: NEGATIVE
Ketones, ur: NEGATIVE mg/dL
Nitrite: NEGATIVE
PROTEIN: NEGATIVE mg/dL
Specific Gravity, Urine: 1.025 (ref 1.005–1.030)
Urobilinogen, UA: 0.2 mg/dL (ref 0.0–1.0)
pH: 6.5 (ref 5.0–8.0)

## 2014-08-29 NOTE — Patient Instructions (Signed)

## 2014-08-29 NOTE — Progress Notes (Signed)
Doing well, some contractions. Reviewed edema. Will watch BP. Signs of preeclampsia reviewed

## 2014-09-03 ENCOUNTER — Encounter (HOSPITAL_COMMUNITY): Payer: Self-pay | Admitting: *Deleted

## 2014-09-03 ENCOUNTER — Inpatient Hospital Stay (HOSPITAL_COMMUNITY)
Admission: AD | Admit: 2014-09-03 | Discharge: 2014-09-04 | DRG: 775 | Disposition: A | Discharge status: Home / Self Care | Payer: Medicaid Other | Source: Ambulatory Visit | Attending: Family Medicine | Admitting: Family Medicine

## 2014-09-03 DIAGNOSIS — IMO0001 Reserved for inherently not codable concepts without codable children: Secondary | ICD-10-CM

## 2014-09-03 DIAGNOSIS — Z3A39 39 weeks gestation of pregnancy: Secondary | ICD-10-CM | POA: Diagnosis present

## 2014-09-03 LAB — CBC
HCT: 37.3 % (ref 36.0–46.0)
Hemoglobin: 12.5 g/dL (ref 12.0–15.0)
MCH: 29.6 pg (ref 26.0–34.0)
MCHC: 33.5 g/dL (ref 30.0–36.0)
MCV: 88.4 fL (ref 78.0–100.0)
PLATELETS: 212 10*3/uL (ref 150–400)
RBC: 4.22 MIL/uL (ref 3.87–5.11)
RDW: 14.4 % (ref 11.5–15.5)
WBC: 11 10*3/uL — ABNORMAL HIGH (ref 4.0–10.5)

## 2014-09-03 LAB — TYPE AND SCREEN
ABO/RH(D): A POS
Antibody Screen: NEGATIVE

## 2014-09-03 LAB — ABO/RH: ABO/RH(D): A POS

## 2014-09-03 MED ORDER — ONDANSETRON HCL 4 MG/2ML IJ SOLN: 4.0000 mg | Freq: Four times a day (QID) | INTRAMUSCULAR | Status: DC | PRN

## 2014-09-03 MED ORDER — SENNOSIDES-DOCUSATE SODIUM 8.6-50 MG PO TABS
2.0000 | ORAL_TABLET | ORAL | Status: DC
  Administered 2014-09-03: 2 via ORAL
  Filled 2014-09-03: qty 2

## 2014-09-03 MED ORDER — LACTATED RINGERS IV SOLN: 500.0000 mL | INTRAVENOUS | Status: DC | PRN

## 2014-09-03 MED ORDER — LACTATED RINGERS IV SOLN: INTRAVENOUS | Status: DC

## 2014-09-03 MED ORDER — OXYTOCIN 40 UNITS IN LACTATED RINGERS INFUSION - SIMPLE MED
INTRAVENOUS | Status: AC
  Filled 2014-09-03: qty 1000

## 2014-09-03 MED ORDER — WITCH HAZEL-GLYCERIN EX PADS: 1.0000 "application " | MEDICATED_PAD | CUTANEOUS | Status: DC | PRN

## 2014-09-03 MED ORDER — OXYCODONE-ACETAMINOPHEN 5-325 MG PO TABS: 2.0000 | ORAL_TABLET | ORAL | Status: DC | PRN

## 2014-09-03 MED ORDER — IBUPROFEN 600 MG PO TABS
600.0000 mg | ORAL_TABLET | Freq: Four times a day (QID) | ORAL | Status: DC
  Administered 2014-09-03 – 2014-09-04 (×5): 600 mg via ORAL
  Filled 2014-09-03 (×5): qty 1

## 2014-09-03 MED ORDER — PRENATAL MULTIVITAMIN CH
1.0000 | ORAL_TABLET | Freq: Every day | ORAL | Status: DC
  Administered 2014-09-04: 1 via ORAL
  Filled 2014-09-03: qty 1

## 2014-09-03 MED ORDER — ACETAMINOPHEN 325 MG PO TABS: 650.0000 mg | ORAL_TABLET | ORAL | Status: DC | PRN

## 2014-09-03 MED ORDER — DIBUCAINE 1 % RE OINT: 1.0000 "application " | TOPICAL_OINTMENT | RECTAL | Status: DC | PRN

## 2014-09-03 MED ORDER — LIDOCAINE HCL (PF) 1 % IJ SOLN
30.0000 mL | INTRAMUSCULAR | Status: DC | PRN
  Filled 2014-09-03: qty 30

## 2014-09-03 MED ORDER — ZOLPIDEM TARTRATE 5 MG PO TABS: 5.0000 mg | ORAL_TABLET | Freq: Every evening | ORAL | Status: DC | PRN

## 2014-09-03 MED ORDER — FLEET ENEMA 7-19 GM/118ML RE ENEM: 1.0000 | ENEMA | RECTAL | Status: DC | PRN

## 2014-09-03 MED ORDER — LIDOCAINE HCL (PF) 1 % IJ SOLN
INTRAMUSCULAR | Status: AC
  Filled 2014-09-03: qty 30

## 2014-09-03 MED ORDER — TETANUS-DIPHTH-ACELL PERTUSSIS 5-2.5-18.5 LF-MCG/0.5 IM SUSP: 0.5000 mL | Freq: Once | INTRAMUSCULAR | Status: DC

## 2014-09-03 MED ORDER — OXYTOCIN 40 UNITS IN LACTATED RINGERS INFUSION - SIMPLE MED
62.5000 mL/h | INTRAVENOUS | Status: DC
  Administered 2014-09-03: 62.5 mL/h via INTRAVENOUS

## 2014-09-03 MED ORDER — CITRIC ACID-SODIUM CITRATE 334-500 MG/5ML PO SOLN: 30.0000 mL | ORAL | Status: DC | PRN

## 2014-09-03 MED ORDER — BENZOCAINE-MENTHOL 20-0.5 % EX AERO
1.0000 "application " | INHALATION_SPRAY | CUTANEOUS | Status: DC | PRN
  Administered 2014-09-03: 1 via TOPICAL
  Filled 2014-09-03: qty 56

## 2014-09-03 MED ORDER — ONDANSETRON HCL 4 MG PO TABS: 4.0000 mg | ORAL_TABLET | ORAL | Status: DC | PRN

## 2014-09-03 MED ORDER — LANOLIN HYDROUS EX OINT: TOPICAL_OINTMENT | CUTANEOUS | Status: DC | PRN

## 2014-09-03 MED ORDER — SIMETHICONE 80 MG PO CHEW: 80.0000 mg | CHEWABLE_TABLET | ORAL | Status: DC | PRN

## 2014-09-03 MED ORDER — DIPHENHYDRAMINE HCL 25 MG PO CAPS: 25.0000 mg | ORAL_CAPSULE | Freq: Four times a day (QID) | ORAL | Status: DC | PRN

## 2014-09-03 MED ORDER — ONDANSETRON HCL 4 MG/2ML IJ SOLN: 4.0000 mg | INTRAMUSCULAR | Status: DC | PRN

## 2014-09-03 MED ORDER — OXYCODONE-ACETAMINOPHEN 5-325 MG PO TABS: 1.0000 | ORAL_TABLET | ORAL | Status: DC | PRN

## 2014-09-03 MED ORDER — OXYTOCIN BOLUS FROM INFUSION: 500.0000 mL | INTRAVENOUS | Status: DC

## 2014-09-03 NOTE — Plan of Care (Signed)
Problem: Consults Goal: Birthing Suites Patient Information Press F2 to bring up selections list  Outcome: Completed/Met Date Met:  09/03/14  Pt 37-[redacted] weeks EGA

## 2014-09-03 NOTE — Progress Notes (Signed)
UR chart review completed.  

## 2014-09-03 NOTE — MAU Note (Signed)
Contractions began 2/16 0830

## 2014-09-03 NOTE — Lactation Note (Signed)
This note was copied from the chart of Carla Otilio MiuWendy Bonenberger. Lactation Consultation Note Initial visit at 9 hours of age.  Mom reports several feedings that baby is latching well and denies concerns.  Baby last fed less than an hour ago for 8 minutes and has been sleepy.  Baby showing feeding cues in visitors arms.  Offered to observe latch now.  MOm holds baby in cradle hold and baby latches well with mouth wide open and sucks several times well and falls asleep.  Discussed holding baby close with chin, cheeks and nose touching breast.  Explained to mom baby can still breath this way as she was trying to pull breast for air space.  Encouraged mom to offer STS during sleepy feedings.  [facility] given and discussed.  Encouraged to feed with early cues on demand.  Early newborn behavior discussed.  Hand expression demonstrated by mom with colostrum visible.  Mom to call for assist as needed.   Patient Name: Carla Sutton'BToday's Date: 09/03/2014 Reason for consult: Initial assessment   Maternal Data Has patient been taught Hand Expression?: Yes Does the patient have breastfeeding experience prior to this delivery?: No  Feeding Feeding Type: Breast Fed Length of feed:  (few sucks baby sleepy)  LATCH Score/Interventions Latch: Grasps breast easily, tongue down, lips flanged, rhythmical sucking.  Audible Swallowing: None  Type of Nipple: Everted at rest and after stimulation  Comfort (Breast/Nipple): Soft / non-tender     Hold (Positioning): Assistance needed to correctly position infant at breast and maintain latch. Intervention(s): Skin to skin;Position options;Support Pillows;Breastfeeding basics reviewed  LATCH Score: 7  Lactation Tools Discussed/Used WIC Program: Yes   Consult Status Consult Status: Follow-up Date: 09/04/14 Follow-up type: In-patient    Beverely RisenShoptaw, Arvella MerlesJana Lynn 09/03/2014, 6:37 PM

## 2014-09-03 NOTE — H&P (Signed)
Carla MiuWendy Sutton is a 29 y.o. female presenting for SOL.  History  G2P0010 at 9110w3d here for SOL.  Contractions starting yesterday morning.  Intensified at 3am.  OB History    Gravida Para Term Preterm AB TAB SAB Ectopic Multiple Living   2 0 0 0 1 0 1 0 0 0      Past Medical History  Diagnosis Date  . Infection     UTI   Past Surgical History  Procedure Laterality Date  . Dilation and curettage of uterus     Family History: family history includes Cancer in her mother; Diabetes in her father and mother; Heart disease in her mother; Hyperlipidemia in her father and mother; Hypertension in her father and mother. Social History:  reports that she has never smoked. She has never used smokeless tobacco. She reports that she does not drink alcohol or use illicit drugs.   Prenatal Transfer Tool  Maternal Diabetes: No Genetic Screening: Normal Maternal Ultrasounds/Referrals: Normal Fetal Ultrasounds or other Referrals:  None Maternal Substance Abuse:  No Significant Maternal Medications:  None Significant Maternal Lab Results:  None Other Comments:  None  Review of Systems  All other systems reviewed and are negative.     Last menstrual period 11/14/2013. Exam Physical Exam  Constitutional: She is oriented to person, place, and time. She appears well-developed and well-nourished.  GI: Soft. Bowel sounds are normal. She exhibits no distension and no mass. There is no tenderness. There is no rebound and no guarding.  Neurological: She is alert and oriented to person, place, and time.  Skin: Skin is warm and dry.  Psychiatric: She has a normal mood and affect. Her behavior is normal. Judgment and thought content normal.    Prenatal labs: ABO, Rh: A/POS/-- (08/20 1331) Antibody: NEG (08/20 1331) Rubella: 3.43 (08/20 1331) RPR: NON REAC (12/16 1606)  HBsAg: NEGATIVE (08/20 1331)  HIV: NONREACTIVE (12/16 1606)  GBS: Negative (01/27 0000)   Assessment/Plan: 1.  G2P0010 2.  IUP at  2210w3d 3.  GBS negative 4.  SOL  Expectant management.   Kingston Guiles JEHIEL 09/03/2014, 5:17 AM

## 2014-09-04 LAB — RPR: RPR Ser Ql: NONREACTIVE

## 2014-09-04 MED ORDER — IBUPROFEN 600 MG PO TABS: 600.0000 mg | ORAL_TABLET | Freq: Four times a day (QID) | ORAL | Status: DC | PRN

## 2014-09-04 NOTE — Lactation Note (Addendum)
This note was copied from the chart of Carla Otilio MiuWendy Bonfiglio. Lactation Consultation Note; Mother states that infant is feeding well. She denies having any nipple tenderness. #9  Latch score recorded by staff nurse . Reviewed hand expression and observed good flow of colostrum . Mother inquired about making more milk. Advised on cue base feeding and hand expression. Discussed use of mothers Milk Plus and fenugreek to use if needed. Advised mother to feed infant when showing cues. Mother was given a hand pump with instructions to use as needed. Mothers nipple tissue intact. Infant sleeping. Advised mother to page as needed for assistance. Mother is not active with WIC. She plans to call and schedule an appointment when she gets home.   Patient Name: Carla Sutton WUJWJ'XToday's Date: 09/04/2014 Reason for consult: Follow-up assessment   Maternal Data    Feeding Feeding Type: Breast Fed Length of feed: 10 min  LATCH Score/Interventions                      Lactation Tools Discussed/Used     Consult Status      Michel BickersKendrick, Raia Amico McCoy 09/04/2014, 2:52 PM

## 2014-09-04 NOTE — Discharge Instructions (Signed)

## 2014-09-04 NOTE — Discharge Summary (Signed)
Obstetric Discharge Summary Reason for Admission: onset of labor Prenatal Procedures: ultrasound Intrapartum Procedures: spontaneous vaginal delivery Postpartum Procedures: none Complications-Operative and Postpartum: none HEMOGLOBIN  Date Value Ref Range Status  09/03/2014 12.5 12.0 - 15.0 g/dL Final  16/10/960403/24/2015 54.013.9 12.2 - 16.2 g/dL Final   HCT  Date Value Ref Range Status  09/03/2014 37.3 36.0 - 46.0 % Final   HCT, POC  Date Value Ref Range Status  10/08/2013 43.6 37.7 - 47.9 % Final   Pt is a 29y/o G2P1011 PPD#1 after a SVD at 39wk3d. She reports no complaints overnight. Pain is controlled with Motrin. She is voiding but has not had a BM or passed flatus. No calf tenderness/pain. She is breast feeding, opting for outpatient circumcision for her son, and remains undecided on her method of contraception. No other complaints noted.     Physical Exam:  General: alert, cooperative and no distress Lochia: appropriate Uterine Fundus: soft DVT Evaluation: No evidence of DVT seen on physical exam.  Discharge Diagnoses: Term Pregnancy-delivered  Discharge Information: Date: 09/04/2014 Activity: unrestricted Diet: routine Medications: PNV and Ibuprofen Condition: stable Instructions: refer to practice specific booklet Discharge to: home   Newborn Data: Live born female  Birth Weight: 6 lb 7.2 oz (2925 g) APGAR: 9, 9  Home with mother.   Eston Estersyler M Mohr, Student-PA 09/04/2014 at 7:35 AM    I have seen and examined this patient and I agree with the above.  Physical Exam:  General: alert, cooperative and no distress Lungs: nl effort Heart: RRR Lochia: appropriate Uterine Fundus: soft DVT Evaluation: No evidence of DVT seen on physical exam.  Cam HaiSHAW, Rachyl Wuebker CNM 7:49 AM 09/04/2014

## 2014-09-05 ENCOUNTER — Ambulatory Visit: Payer: Self-pay

## 2014-09-05 NOTE — Lactation Note (Signed)
This note was copied from the chart of Carla Otilio MiuWendy Teigen. Lactation Consultation Note: Observed mother feeding in cradle hold . Infant on the breast with lips pursed. Assist mother with cross cradle hold and taught mother to adjust infants lips and lower jaw for wider gape. Reviewed cue base feeding , continuing to allow for cluster feeding. Mother advised to feed infant 8-12 times in 24 hours. Treatment plan to prevent severe engorgement. [facility] She has a hand pump for use as needed. Informed mother of available LC services and community support.   Patient Name: Carla Sutton GNFAO'ZToday's Date: 09/05/2014 Reason for consult: Follow-up assessment   Maternal Data    Feeding Feeding Type: Breast Fed Length of feed: 15 min  LATCH Score/Interventions Latch: Grasps breast easily, tongue down, lips flanged, rhythmical sucking.  Audible Swallowing: Spontaneous and intermittent Intervention(s): Hand expression  Type of Nipple: Everted at rest and after stimulation  Comfort (Breast/Nipple): Filling, red/small blisters or bruises, mild/mod discomfort  Problem noted: Filling Interventions (Filling): Hand pump  Hold (Positioning): Assistance needed to correctly position infant at breast and maintain latch.  LATCH Score: 8  Lactation Tools Discussed/Used     Consult Status Consult Status: Complete    Michel BickersKendrick, Sheila Gervasi McCoy 09/05/2014, 11:24 AM

## 2014-10-02 ENCOUNTER — Ambulatory Visit (INDEPENDENT_AMBULATORY_CARE_PROVIDER_SITE_OTHER): Payer: Medicaid Other | Admitting: Family Medicine

## 2014-10-02 ENCOUNTER — Encounter: Payer: Self-pay | Admitting: Family Medicine

## 2014-10-02 NOTE — Patient Instructions (Signed)
  Place postpartum visit patient instructions here.  

## 2014-10-02 NOTE — Progress Notes (Signed)
  Subjective:     Carla Sutton is a 29 y.o. female who presents for a postpartum visit. She is 4 weeks postpartum following a spontaneous vaginal delivery. I have fully reviewed the prenatal and intrapartum course. The delivery was at 39 gestational weeks. Outcome: spontaneous vaginal delivery. Anesthesia: none. Postpartum course has been normal. Baby's course has been normal. Baby is feeding by breast. Bleeding staining only. Bowel function is normal. Bladder function is normal. Patient is not sexually active. Contraception method is condoms. Postpartum depression screening: negative.  The following portions of the patient's history were reviewed and updated as appropriate: allergies, current medications, past family history, past medical history, past social history, past surgical history and problem list.  Review of Systems Pertinent items are noted in HPI.   Objective:    BP 126/90 mmHg  Pulse 75  Temp(Src) 98.6 F (37 C) (Oral)  Wt 201 lb (91.173 kg)  Breastfeeding? Yes  General:  alert, cooperative and no distress     Lungs: clear to auscultation bilaterally  Heart:  regular rate and rhythm, S1, S2 normal, no murmur, click, rub or gallop  Abdomen: soft, non-tender; bowel sounds normal; no masses,  no organomegaly        Assessment:     Normal postpartum exam. Pap smear not done at today's visit.   Plan:    1. Contraception: condoms 2. Follow up in: 1 year or as needed.

## 2014-10-06 ENCOUNTER — Encounter: Payer: Self-pay | Admitting: *Deleted

## 2014-10-06 ENCOUNTER — Telehealth: Payer: Self-pay | Admitting: *Deleted

## 2014-10-06 NOTE — Telephone Encounter (Signed)
Contacted patient, pt will come to the clinic to sign ROI and pick up FMLA paperwork.

## 2014-10-06 NOTE — Progress Notes (Unsigned)
FMLA completed, will contact patient to come to office and sign ROI or pick up FMLA paperwork.

## 2014-10-29 ENCOUNTER — Telehealth: Payer: Self-pay

## 2014-10-29 NOTE — Telephone Encounter (Signed)
Patient walked into clinic today. ROI signed and FMLA paper given. Copy of paperwork and ROI placed in scan folder to be scanned into chart by front office staff.

## 2014-11-29 ENCOUNTER — Encounter (HOSPITAL_COMMUNITY): Payer: Self-pay | Admitting: Emergency Medicine

## 2014-11-29 ENCOUNTER — Emergency Department (HOSPITAL_COMMUNITY)
Admission: EM | Admit: 2014-11-29 | Discharge: 2014-11-29 | Disposition: A | Discharge status: Home / Self Care | Payer: Medicaid Other | Attending: Emergency Medicine | Admitting: Emergency Medicine

## 2014-11-29 DIAGNOSIS — L03319 Cellulitis of trunk, unspecified: Secondary | ICD-10-CM | POA: Insufficient documentation

## 2014-11-29 DIAGNOSIS — Z87440 Personal history of urinary (tract) infections: Secondary | ICD-10-CM | POA: Insufficient documentation

## 2014-11-29 DIAGNOSIS — L02219 Cutaneous abscess of trunk, unspecified: Secondary | ICD-10-CM | POA: Insufficient documentation

## 2014-11-29 DIAGNOSIS — L0291 Cutaneous abscess, unspecified: Secondary | ICD-10-CM

## 2014-11-29 MED ORDER — CEPHALEXIN 500 MG PO CAPS: 500.0000 mg | ORAL_CAPSULE | Freq: Four times a day (QID) | ORAL | Status: DC

## 2014-11-29 MED ORDER — LIDOCAINE HCL 2 % IJ SOLN
10.0000 mL | Freq: Once | INTRAMUSCULAR | Status: AC
  Administered 2014-11-29: 200 mg
  Filled 2014-11-29: qty 20

## 2014-11-29 MED ORDER — ACETAMINOPHEN 325 MG PO TABS
650.0000 mg | ORAL_TABLET | Freq: Once | ORAL | Status: AC
  Administered 2014-11-29: 650 mg via ORAL
  Filled 2014-11-29: qty 2

## 2014-11-29 MED ORDER — SULFAMETHOXAZOLE-TRIMETHOPRIM 800-160 MG PO TABS: 1.0000 | ORAL_TABLET | Freq: Two times a day (BID) | ORAL | Status: AC

## 2014-11-29 MED ORDER — CEPHALEXIN 500 MG PO CAPS
500.0000 mg | ORAL_CAPSULE | Freq: Once | ORAL | Status: AC
  Administered 2014-11-29: 500 mg via ORAL
  Filled 2014-11-29: qty 1

## 2014-11-29 MED ORDER — SULFAMETHOXAZOLE-TRIMETHOPRIM 800-160 MG PO TABS
1.0000 | ORAL_TABLET | Freq: Once | ORAL | Status: AC
  Administered 2014-11-29: 1 via ORAL
  Filled 2014-11-29: qty 1

## 2014-11-29 NOTE — ED Provider Notes (Signed)
CSN: [id number]     Arrival date & time 11/29/14  0745 History   First MD Initiated Contact with Patient 11/29/14 0757     Chief Complaint  Patient presents with  . Abscess  . Tachycardia    HPI   29 year old female presents with an abscess to her left upper torso 3 days. Patient the abscess started as a small bump began to become red and firm, tender to palpation. Patient denies any history of MRSA infections or contact with anyone having it. Patient denies fever, chills, nausea, vomiting, dizziness chest pain, abdominal pain, or any other distant infections. She reports that she's attempted to evacuate the abscess on her own that she's done this previously in the past.  Past Medical History  Diagnosis Date  . Infection     UTI   Past Surgical History  Procedure Laterality Date  . Dilation and curettage of uterus     Family History  Problem Relation Age of Onset  . Cancer Mother   . Diabetes Mother   . Heart disease Mother   . Hyperlipidemia Mother   . Hypertension Mother   . Hyperlipidemia Father   . Hypertension Father   . Diabetes Father    History  Substance Use Topics  . Smoking status: Never Smoker   . Smokeless tobacco: Never Used  . Alcohol Use: No   OB History    Gravida Para Term Preterm AB TAB SAB Ectopic Multiple Living   2 1 1  0 1 0 1 0 0 1     Review of Systems  All other systems reviewed and are negative.  Allergies  Shellfish allergy  Home Medications   Prior to Admission medications   Medication Sig Start Date End Date Taking? Authorizing Provider  ibuprofen (ADVIL,MOTRIN) 600 MG tablet Take 1 tablet (600 mg total) by mouth every 6 (six) hours as needed. Patient taking differently: Take 600 mg by mouth every 6 (six) hours as needed for moderate pain.  09/04/14  Yes Arabella MerlesKimberly D Shaw, CNM   BP 122/80 mmHg  Pulse 113  Temp(Src) 98 F (36.7 C) (Oral)  Resp 20  Ht 5\' 2"  (1.575 m)  Wt 190 lb (86.183 kg)  BMI 34.74 kg/m2  SpO2 97%   Breastfeeding? Yes   Physical Exam  Constitutional: She is oriented to person, place, and time. She appears well-developed and well-nourished.  HENT:  Head: Normocephalic and atraumatic.  Eyes: Pupils are equal, round, and reactive to light.  Neck: Normal range of motion. Neck supple. No JVD present. No tracheal deviation present. No thyromegaly present.  Cardiovascular: Regular rhythm, normal heart sounds and intact distal pulses.  Exam reveals no gallop and no friction rub.   No murmur heard. Pulmonary/Chest: Effort normal and breath sounds normal. No stridor. No respiratory distress. She has no wheezes. She has no rales. She exhibits no tenderness.  Abdominal: Soft. There is no tenderness.  Musculoskeletal: Normal range of motion.  Lymphadenopathy:    She has no cervical adenopathy.  Neurological: She is alert and oriented to person, place, and time. Coordination normal.  Skin: Skin is warm and dry.  Abscess left upper torso, with surrounding cellulitis. Tender to palpation, and fluctuant. Bedside Ultrasound shows a small collection of fluid.  Psychiatric: She has a normal mood and affect. Her behavior is normal. Judgment and thought content normal.  Nursing note and vitals reviewed.   ED Course  Procedures (including critical care time)  INCISION AND DRAINAGE Performed by: Lulie Hurd  Todd Consent: Verbal consent obtained. Risks and benefits: risks, benefits and alternatives were discussed Type: abscess  Body area: Left upper torso midline  Anesthesia: local infiltration  Incision was made with a scalpel.  Local anesthetic: lidocaine 2%   Anesthetic total:  3 ml  Complexity: complex Blunt dissection to break up loculations  Drainage: purulent  Drainage amount: .5ml   Packing material: none  Patient tolerance: Patient tolerated the procedure well with no immediate complications.   Labs Review Labs Reviewed - No data to display  Imaging Review No results  found.   EKG Interpretation   Date/Time:  Saturday Nov 29 2014 07:58:10 EDT Ventricular Rate:  124 PR Interval:  144 QRS Duration: 79 QT Interval:  302 QTC Calculation: 434 R Axis:   165 Text Interpretation:  Sinus tachycardia Probable right ventricular  hypertrophy No previous tracing Confirmed by Anitra LauthPLUNKETT  MD, Alphonzo LemmingsWHITNEY ([id number])  on 11/29/2014 8:43:08 AM      MDM   Final diagnoses:  Abscess  Cellulitis of trunk, unspecified site    Imaging: Bedside ultrasound perform by me showed a small pocket of fluid beneath the dermal layer  Therapeutics: Lidocaine, acetaminophen, Bactrim, Keflex  Assessment: Abscess with surrounding cellulitis  Plan: Patient presents with an abscess with surrounding cellulitis 3 days. Ports that she's attempted to evacuate it on her own causing increased redness, pain, swelling. Patient has no significant infectious history, is a healthy 29 year old female taking no medications at home aside from ibuprofen. She is breast-feeding a 286-month-old child at this time, pharmacy was consulted for Bactrim and Keflex administration who agreed that both would be safe in this patient. Given a dose of each along with Tylenol in the ED. Patient was slightly tachycardic, but remained afebrile normal blood pressure, is not having any other complaints in addition to the localized abscess; no concern for systemic infection. Patient was discharged home with I&D care instructions, and strict return precautions in the event new or worsening signs or symptoms presented. Patient verbalizes her understanding plan, and assured her medication compliance, and verbalize she would follow-up if symptoms persisted or did not worsen. Patient was instructed to follow-up in 3 days with her primary care provider or [facility] for reevaluation of abscess. Patient verbalized her follow-up. No acute concerns at time of discharge.     Eyvonne MechanicJeffrey Everitt Wenner, PA-C 11/29/14 1156  Gwyneth SproutWhitney  Plunkett, MD 11/29/14 1535

## 2014-11-29 NOTE — Discharge Instructions (Signed)
Abscess °Care After °An abscess (also called a boil or furuncle) is an infected area that contains a collection of pus. Signs and symptoms of an abscess include pain, tenderness, redness, or hardness, or you may feel a moveable soft area under your skin. An abscess can occur anywhere in the body. The infection may spread to surrounding tissues causing cellulitis. A cut (incision) by the surgeon was made over your abscess and the pus was drained out. Gauze may have been packed into the space to provide a drain that will allow the cavity to heal from the inside outwards. The boil may be painful for 5 to 7 days. Most people with a boil do not have high fevers. Your abscess, if seen early, may not have localized, and may not have been lanced. If not, another appointment may be required for this if it does not get better on its own or with medications. °HOME CARE INSTRUCTIONS  °· Only take over-the-counter or prescription medicines for pain, discomfort, or fever as directed by your caregiver. °· When you bathe, soak and then remove gauze or iodoform packs at least daily or as directed by your caregiver. You may then wash the wound gently with mild soapy water. Repack with gauze or do as your caregiver directs. °SEEK IMMEDIATE MEDICAL CARE IF:  °· You develop increased pain, swelling, redness, drainage, or bleeding in the wound site. °· You develop signs of generalized infection including muscle aches, chills, fever, or a general ill feeling. °· An oral temperature above 102° F (38.9° C) develops, not controlled by medication. °See your caregiver for a recheck if you develop any of the symptoms described above. If medications (antibiotics) were prescribed, take them as directed. °Document Released: 01/20/2005 Document Revised: 09/26/2011 Document Reviewed: 09/17/2007 °ExitCare® Patient Information ©2015 [employer]. This information is not intended to replace advice given to you by your health care provider. Make sure  you discuss any questions you have with your health care provider. ° °Cellulitis °Cellulitis is an infection of the skin and the tissue beneath it. The infected area is usually red and tender. Cellulitis occurs most often in the arms and lower legs.  °CAUSES  °Cellulitis is caused by bacteria that enter the skin through cracks or cuts in the skin. The most common types of bacteria that cause cellulitis are staphylococci and streptococci. °SIGNS AND SYMPTOMS  °· Redness and warmth. °· Swelling. °· Tenderness or pain. °· Fever. °DIAGNOSIS  °Your health care provider can usually determine what is wrong based on a physical exam. Blood tests may also be done. °TREATMENT  °Treatment usually involves taking an antibiotic medicine. °HOME CARE INSTRUCTIONS  °· Take your antibiotic medicine as directed by your health care provider. Finish the antibiotic even if you start to feel better. °· Keep the infected arm or leg elevated to reduce swelling. °· Apply a warm cloth to the affected area up to 4 times per day to relieve pain. °· Take medicines only as directed by your health care provider. °· Keep all follow-up visits as directed by your health care provider. °SEEK MEDICAL CARE IF:  °· You notice red streaks coming from the infected area. °· Your red area gets larger or turns dark in color. °· Your bone or joint underneath the infected area becomes painful after the skin has healed. °· Your infection returns in the same area or another area. °· You notice a swollen bump in the infected area. °· You develop new symptoms. °· You have   a fever. SEEK IMMEDIATE MEDICAL CARE IF:   You feel very sleepy.  You develop vomiting or diarrhea.  You have a general ill feeling (malaise) with muscle aches and pains. MAKE SURE YOU:   Understand these instructions.  Will watch your condition.  Will get help right away if you are not doing well or get worse. Document Released: 04/13/2005 Document Revised: 11/18/2013 Document  Reviewed: 09/19/2011 [facility] Patient Information 2015 [place], [place]. This information is not intended to replace advice given to you by your health care provider. Make sure you discuss any questions you have with your health care provider.  Please read the attached information. Please follow-up immediately if new or worsening signs or symptoms present. [facility] or your primary care provider for further evaluation and management of this abscess. Follow-up sooner if needed.

## 2014-11-29 NOTE — ED Notes (Signed)
Hedges, PA aware of ST on monitor.

## 2014-11-29 NOTE — ED Notes (Signed)
Cleanse area to I&D with saline and applied dry dsg. Pt tolerated well. Pt given teaching on keeping area clean and dry and not to remove dsg for 24 hrs. Verbalized understanding.

## 2014-11-29 NOTE — ED Notes (Signed)
Pt reported with abscess to lt lateral upper torso with firm area surrounded by redness. Pt reported painful to touch. ST on monitor at 121-125bpm. Pt denies SHOB, chest pain, palpitations, diaphoresis, dizziness/lightheadedness or n/v.

## 2014-11-29 NOTE — ED Notes (Signed)
Awake. Verbally responsive. A/O x4. Resp even and unlabored. No audible adventitious breath sounds noted. ABC's intact. ST on monitor at 109-121bpm. Waiting on MD/PA at bedside to I&D abscess.

## 2014-11-29 NOTE — ED Notes (Signed)
Awake. Verbally responsive. A/O x4. Resp even and unlabored. No audible adventitious breath sounds noted. ABC's intact.  

## 2014-11-29 NOTE — ED Notes (Signed)
Hedges, PA at bedside to I&D abscess. Pt tolerating well.

## 2014-11-29 NOTE — ED Notes (Signed)
Pt c/o reddened, edematous abscess to left axillary line, no pus visible. Pt states she had two smaller and less painful abscess in same area previously, her boyfriend squeezed pus out. HR 135.

## 2016-03-30 IMAGING — US US OB DETAIL+14 WK
1 series · 12 of 28 positions shown · non-contrast
Comparison: none

[Series 1: us ob comp +14 wk mfm · 12 of 55 slices shown]
[im 3/55]
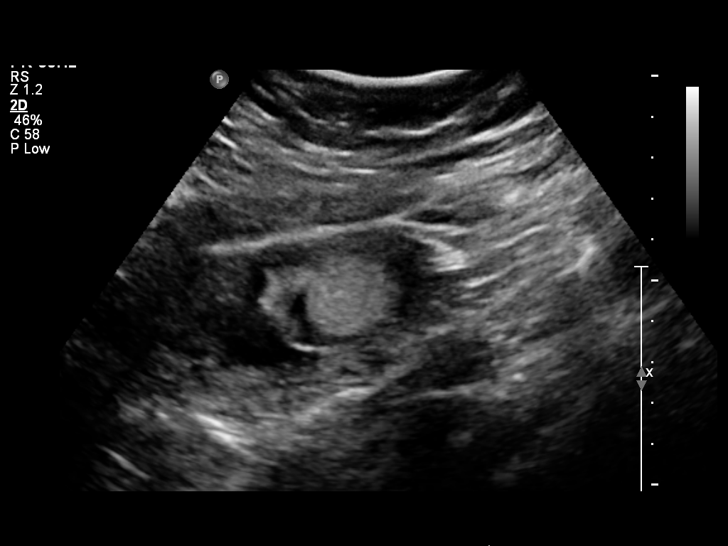
[im 7/55]
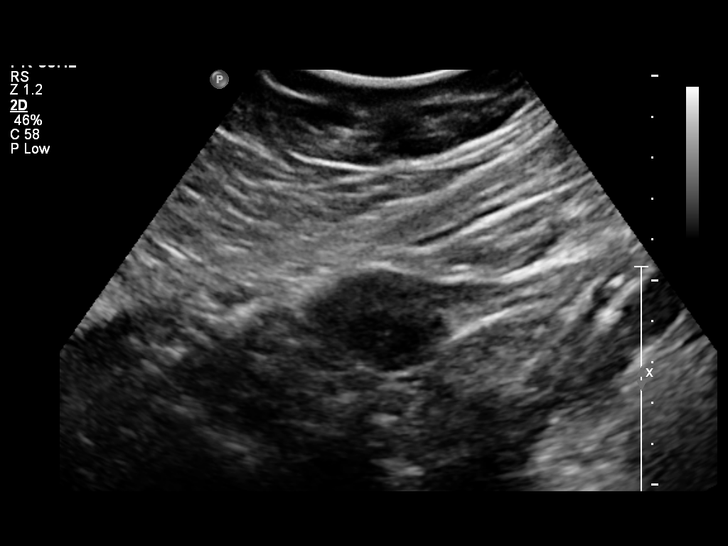
[im 11/55]
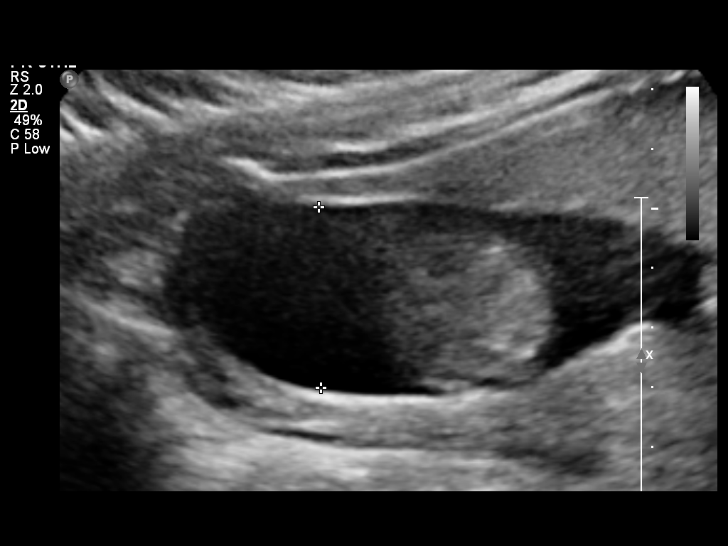
[im 17/55]
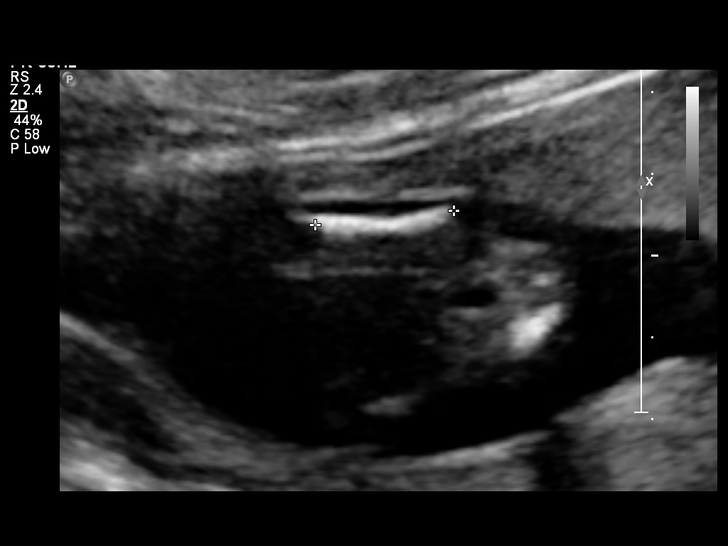
[im 21/55]
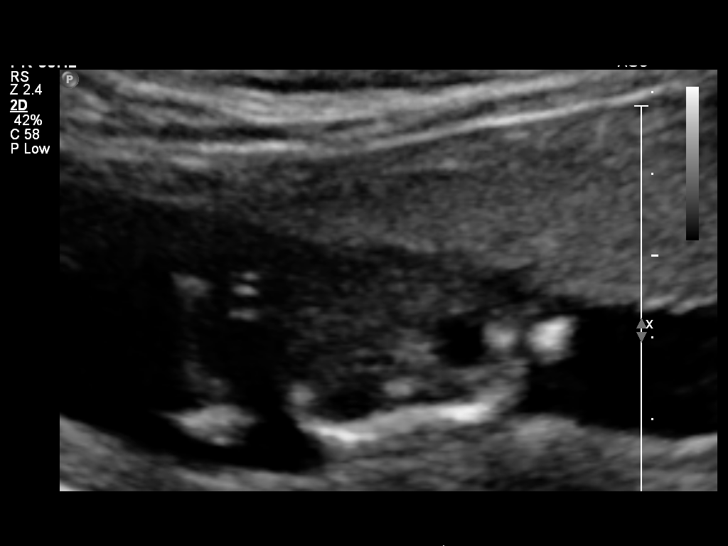
[im 25/55]
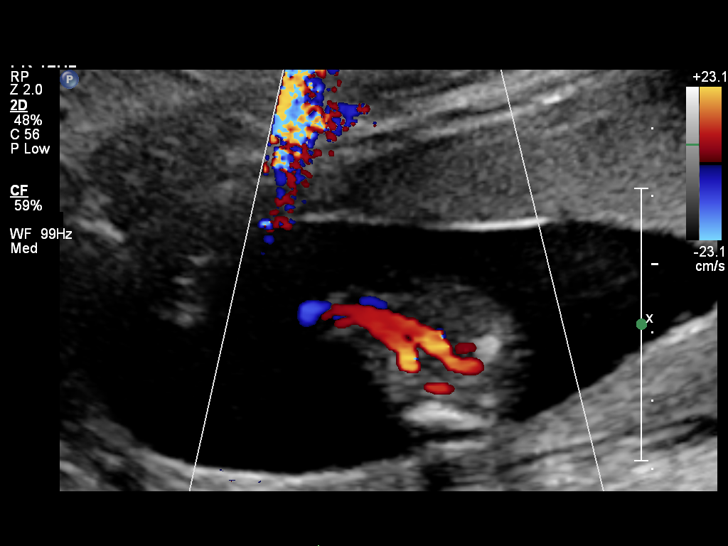
[im 31/55]
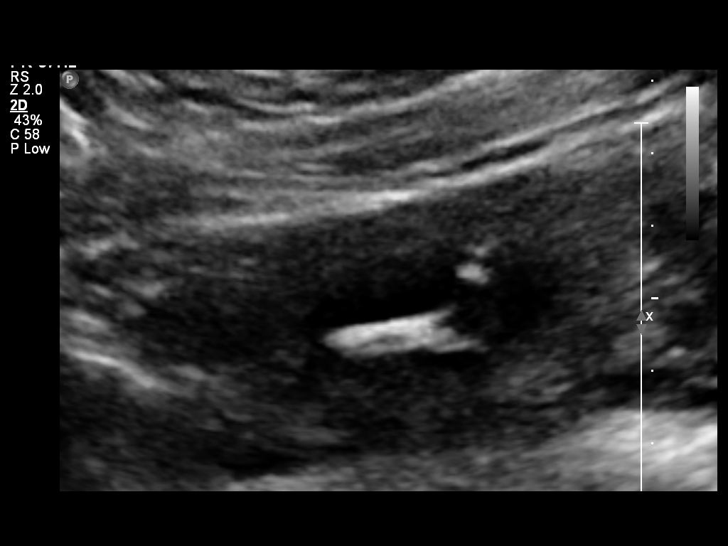
[im 35/55]
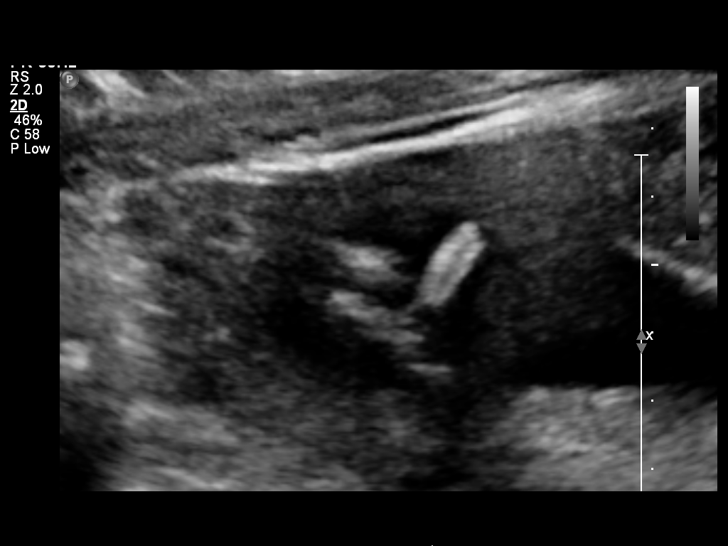
[im 39/55]
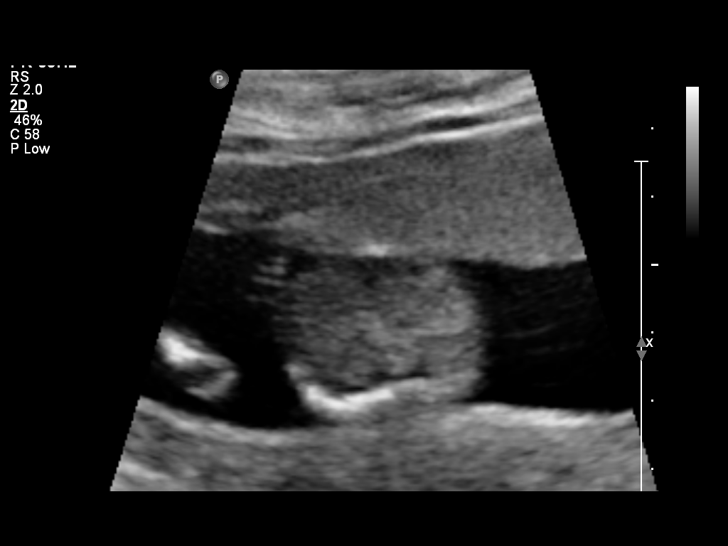
[im 45/55]
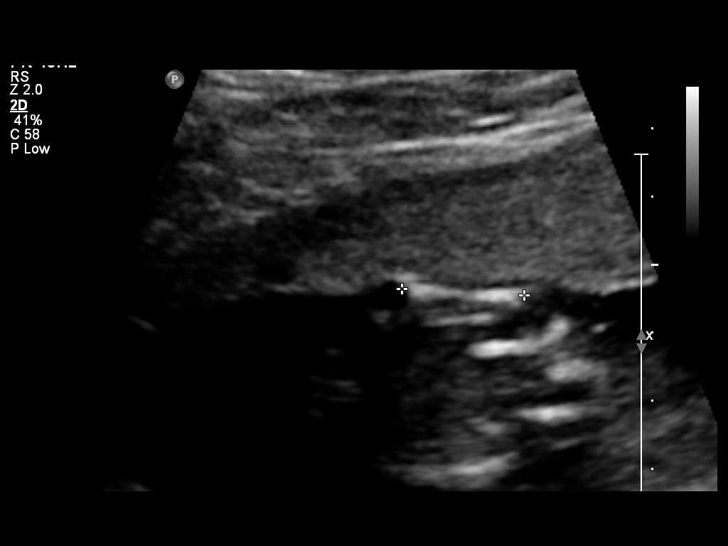
[im 49/55]
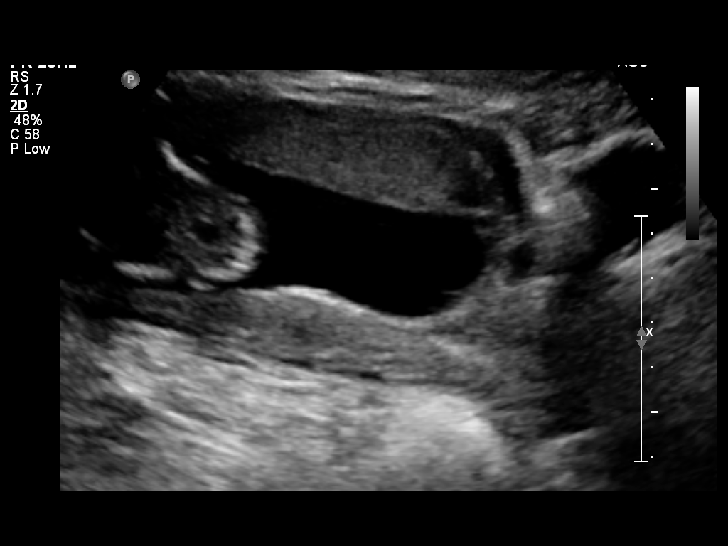
[im 53/55]
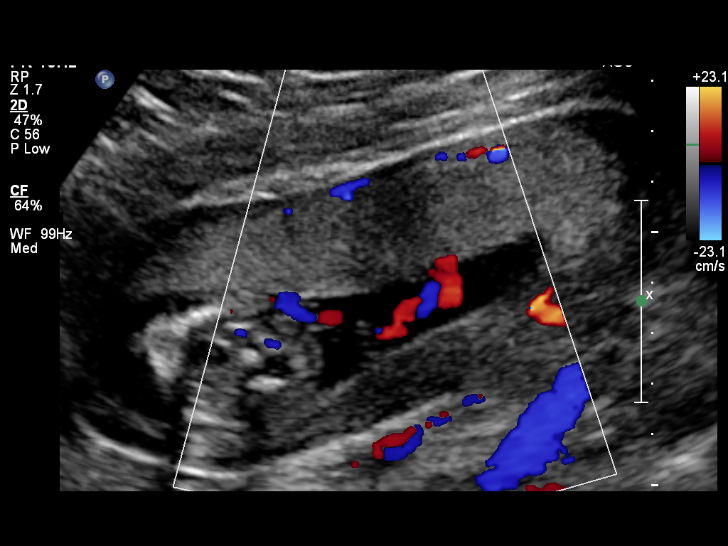

[12 of 28 positions shown; findings below may reference images not displayed]

OBSTETRICS REPORT
                      (Signed Final 03/20/2014 [DATE])

                                                         Faculty Physician
Service(s) Provided

 US OB DETAIL + 14 WK                                  76811.0
Indications

 Detailed fetal anatomic survey
 Obesity complicating pregnancy                        649.13,
Fetal Evaluation

 Num Of Fetuses:    1
 Fetal Heart Rate:  153                          bpm
 Cardiac Activity:  Observed
 Presentation:      Transverse, head to
                    maternal left
 Placenta:          Anterior, above cervical os
 P. Cord            Visualized
 Insertion:

 Amniotic Fluid
 AFI FV:      Subjectively within normal limits
                                             Larg Pckt:     3.0  cm
Biometry

 BPD:       31  mm     G. Age:  15w 5d                CI:        71.23   70 - 86
                                                      FL/HC:      14.5   13.3 -

 HC:       117  mm     G. Age:  15w 6d       45  %    HC/AC:      1.23   1.05 -

 AC:      94.8  mm     G. Age:  15w 4d       54  %    FL/BPD:
 FL:        17  mm     G. Age:  15w 0d       24  %    FL/AC:      17.9   20 - 24
 HUM:     17.7  mm     G. Age:  15w 0d       39  %
 Est. FW:     122  gm      0 lb 4 oz     67  %
Gestational Age

 LMP:           18w 0d        Date:  11/14/13                 EDD:   08/21/14
 U/S Today:     15w 4d                                        EDD:   09/07/14
 Best:          15w 4d     Det. By:  U/S (03/20/14)           EDD:   09/07/14
Anatomy
 Cranium:          Not well visualized    Aortic Arch:      Not well visualized
 Fetal Cavum:      Not well visualized    Ductal Arch:      Not well visualized
 Ventricles:       Not well visualized    Diaphragm:        Appears normal
 Choroid Plexus:   Appears normal         Stomach:          Appears normal, left
                                                            sided
 Cerebellum:       Not well visualized    Abdomen:          Appears normal
 Posterior Fossa:  Not well visualized    Abdominal Wall:   Appears nml (cord
                                                            insert, abd wall)
 Nuchal Fold:      Not well visualized    Cord Vessels:     Appears normal (3
                                                            vessel cord)
 Face:             Not well visualized    Kidneys:          Appear normal
 Lips:             Not well visualized    Bladder:          Appears normal
 Heart:            Not well visualized    Spine:            Not well visualized
 RVOT:             Not well visualized    Lower             Appears normal
                                          Extremities:
 LVOT:             Not well visualized    Upper             Appears normal
                                          Extremities:

 Other:  Male gender. Heels visualized. Technically difficult due to maternal
         habitus and fetal position. Technically difficult due to early gestational
         age.
Cervix Uterus Adnexa

 Cervix:       Normal appearance by transabdominal scan.
 Left Ovary:    Within normal limits.
 Right Ovary:   Within normal limits.

 Adnexa:     No abnormality visualized.
Impression

 Single IUP at 15w 4d
 Limited views of the fetal anatomy obtained due to early
 gestational age
 No gross anomalies were noted
 Normal amniotic fluid volume
Recommendations

 Recommend follow-up ultrasound examination in 3-4 weeks
 to reevaluate fetal anatomy
 Adjust EDD based on today's study

 questions or concerns.

## 2016-04-20 IMAGING — US US OB FOLLOW-UP
1 series · 12 of 28 positions shown · non-contrast
Comparison: none

[Series 1: us ob follow up · 12 of 66 slices shown]
[im 3/66]
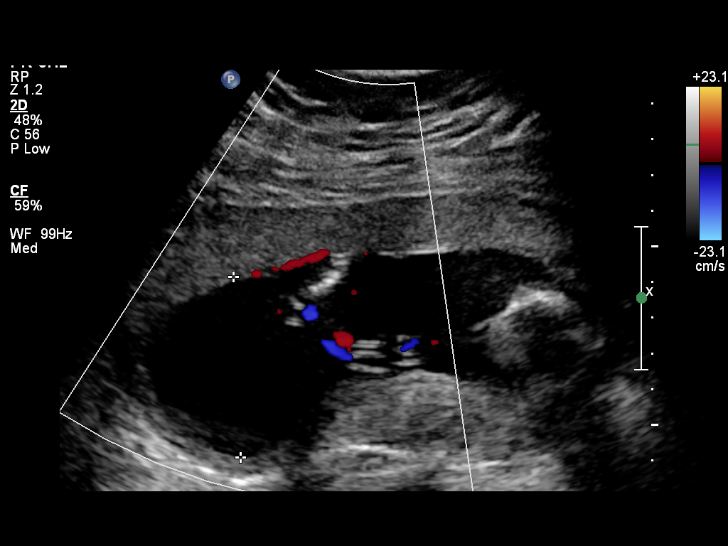
[im 8/66]
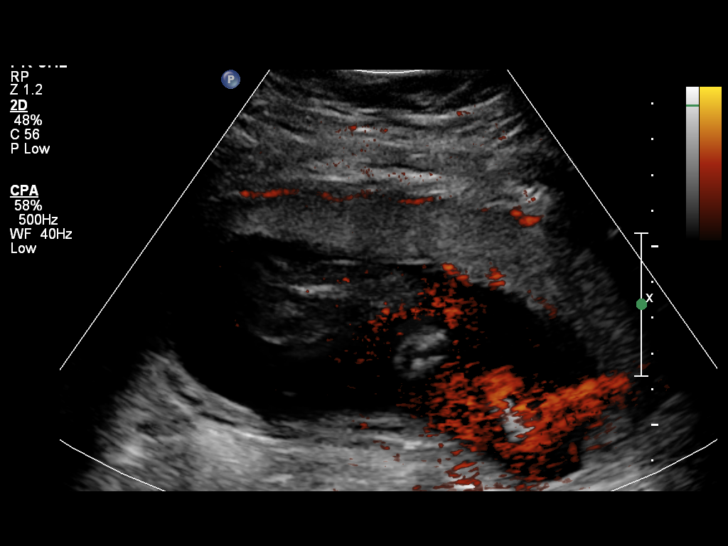
[im 13/66]
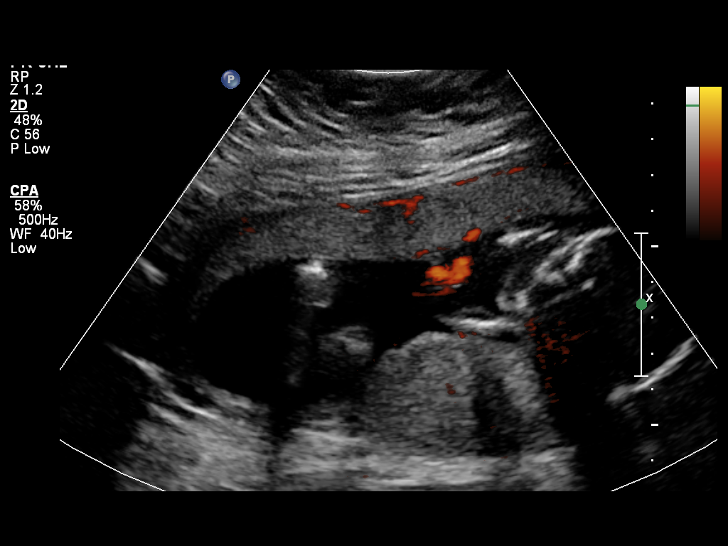
[im 20/66]
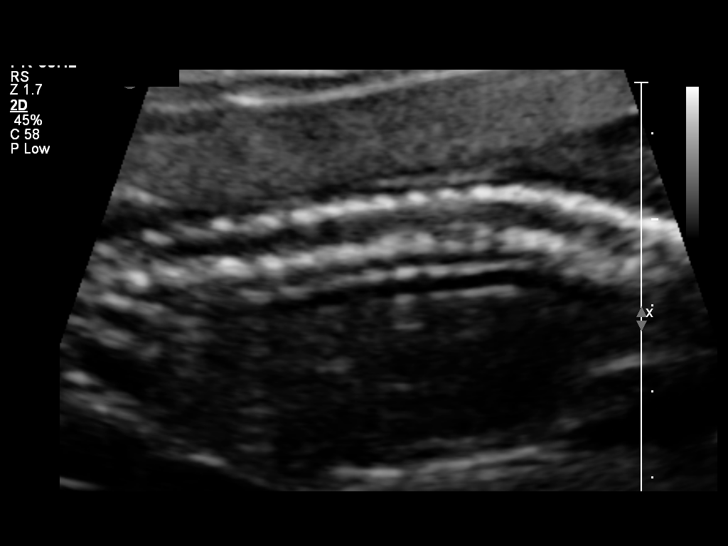
[im 25/66]
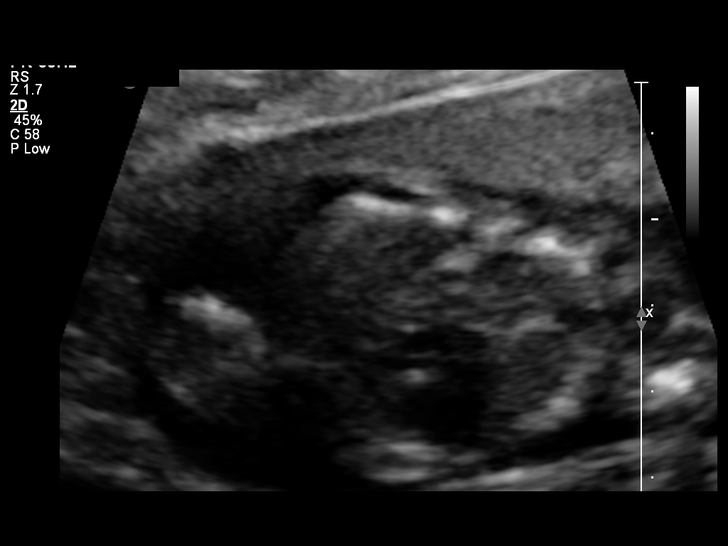
[im 29/66]
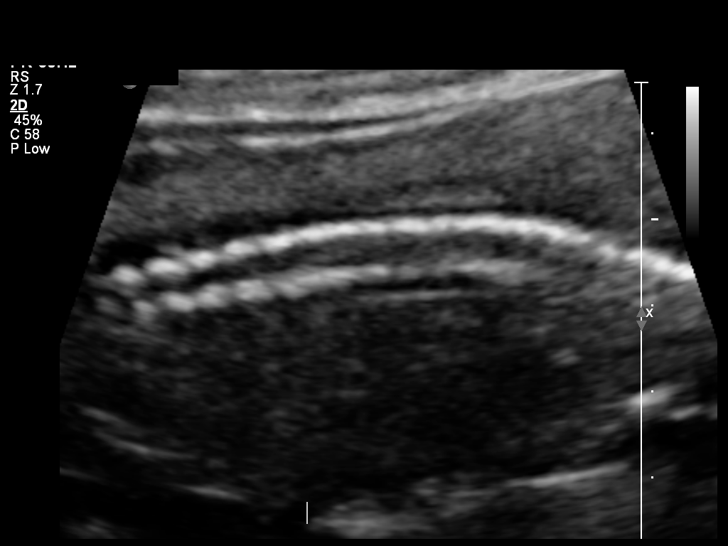
[im 37/66]
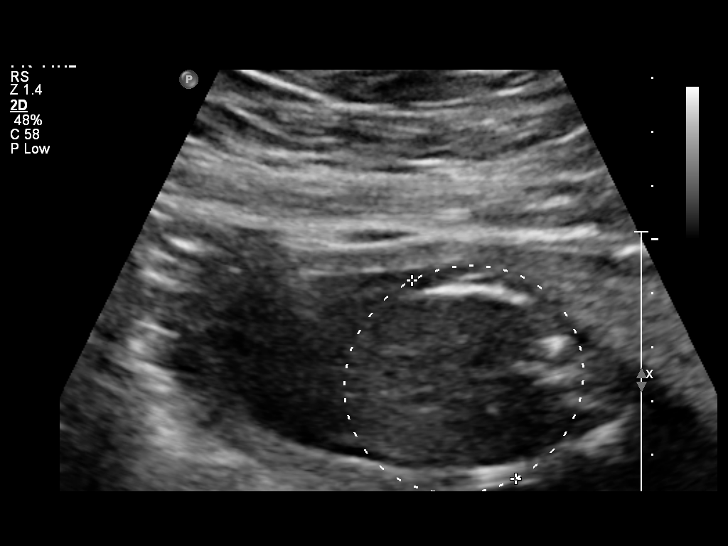
[im 41/66]
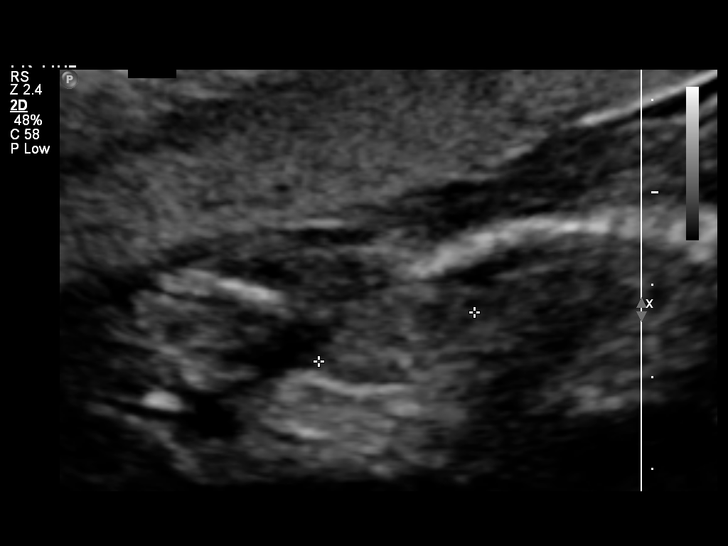
[im 46/66]
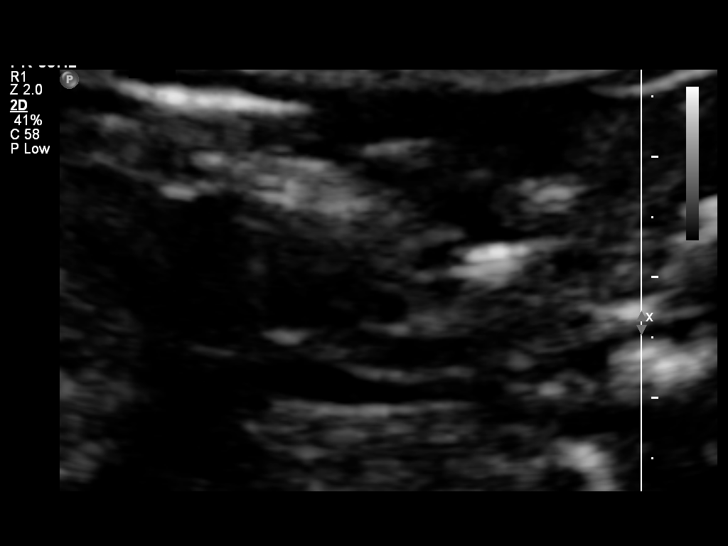
[im 53/66]
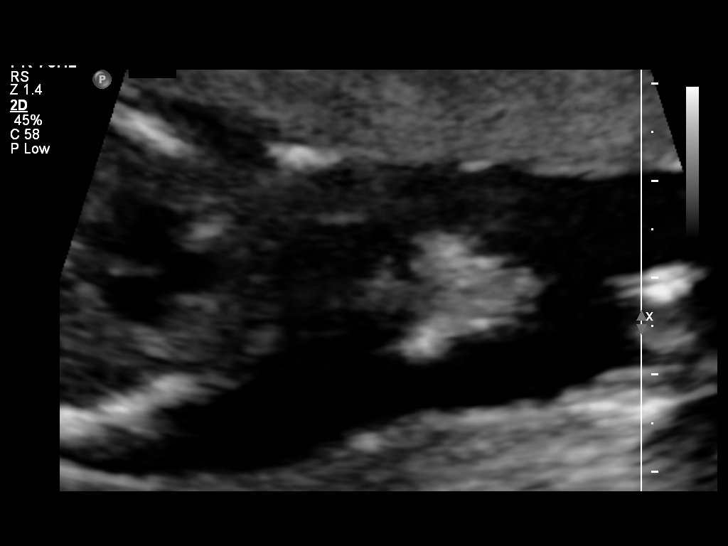
[im 58/66]
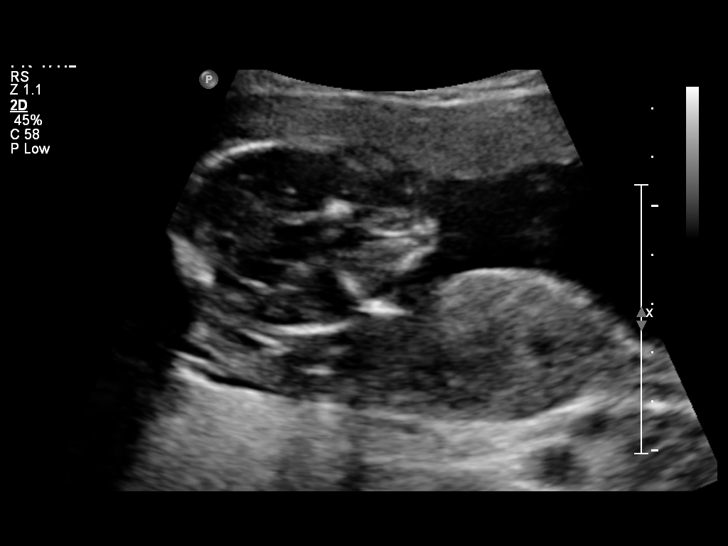
[im 63/66]
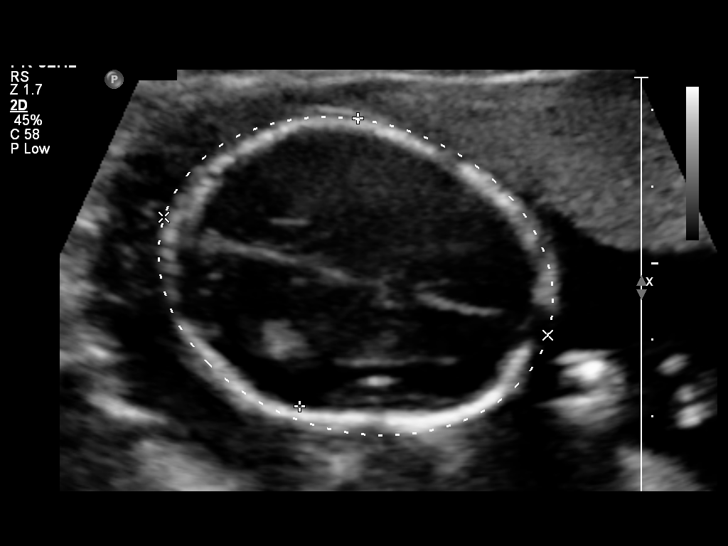

[12 of 28 positions shown; findings below may reference images not displayed]

OBSTETRICS REPORT
                      (Signed Final 04/10/2014 [DATE])

Service(s) Provided

 US OB FOLLOW UP                                       76816.1
Indications

 Follow-up incomplete fetal anatomic evaluation
 Obesity complicating pregnancy                        649.13,
Fetal Evaluation

 Num Of Fetuses:    1
 Fetal Heart Rate:  158                          bpm
 Cardiac Activity:  Observed
 Presentation:      Cephalic
 Placenta:          Anterior, above cervical os
 P. Cord            Visualized, central
 Insertion:

 Comment:    No placental abruption or previa identified.

 Amniotic Fluid
 AFI FV:      Subjectively within normal limits
                                             Larg Pckt:     5.0  cm
Biometry

 BPD:     39.4  mm     G. Age:  18w 0d                CI:         70.9   70 - 86
                                                      FL/HC:      20.9   16.1 -

 HC:     149.1  mm     G. Age:  18w 0d       18  %    HC/AC:      1.10   1.09 -

 AC:       135  mm     G. Age:  19w 0d       60  %    FL/BPD:
 FL:      31.1  mm     G. Age:  19w 4d       80  %    FL/AC:      23.0   20 - 24
 HUM:       27  mm     G. Age:  18w 4d       53  %
 CER:     17.8  mm     G. Age:  17w 6d       27  %
 NFT:     3.72  mm

 Est. FW:     275  gm    0 lb 10 oz      55  %
Gestational Age

 LMP:           21w 0d        Date:  11/14/13                 EDD:   08/21/14
 U/S Today:     18w 4d                                        EDD:   09/07/14
 Best:          18w 4d     Det. By:  U/S (03/20/14)           EDD:   09/07/14
Anatomy
 Cranium:          Appears normal         Aortic Arch:      Not well visualized
 Fetal Cavum:      Appears normal         Ductal Arch:      Not well visualized
 Ventricles:       Appears normal         Diaphragm:        Appears normal
 Choroid Plexus:   Appears normal         Stomach:          Appears normal, left
                                                            sided
 Cerebellum:       Appears normal         Abdomen:          Appears normal
 Posterior Fossa:  Appears normal         Abdominal Wall:   Previously seen
 Nuchal Fold:      Appears normal         Cord Vessels:     Previously seen
 Face:             Appears normal         Kidneys:          Appear normal
                   (orbits and profile)
 Lips:             Appears normal         Bladder:          Appears normal
 Heart:            Not well visualized    Spine:            Appears normal
 RVOT:             Not well visualized    Lower             Previously seen
                                          Extremities:
 LVOT:             Appears normal         Upper             Previously seen
                                          Extremities:

 Other:  Male gender. Heels prev. visualized. Technically difficult due to
         maternal habitus and fetal position. Technically difficult due to early
         gestational age.
Targeted Anatomy

 Fetal Central Nervous System
 Cisterna Magna:
Cervix Uterus Adnexa

 Cervical Length:    4.36     cm

 Cervix:       Normal appearance by transabdominal scan.
Impression

 Single IUP at 18w 4d
 Normal fetal anatomic survey; however, limited views of the
 fetal heart obtained
 No markers associated with aneuploidy were noted
 Anterior placenta without previa
 No subchorioninc fluid collections noted
 Normal amniotc fluid volume
Recommendations

 Recommend follow-up ultrasound examination in 4-6 weeks
 to reevaluate the fetal heart

 questions or concerns.

## 2016-05-22 IMAGING — US US OB FOLLOW-UP
1 series · 12 of 28 positions shown · non-contrast
Comparison: none

[Series 1: us ob follow up · 70 acquisitions, 12 frames shown]
[im 3/70]
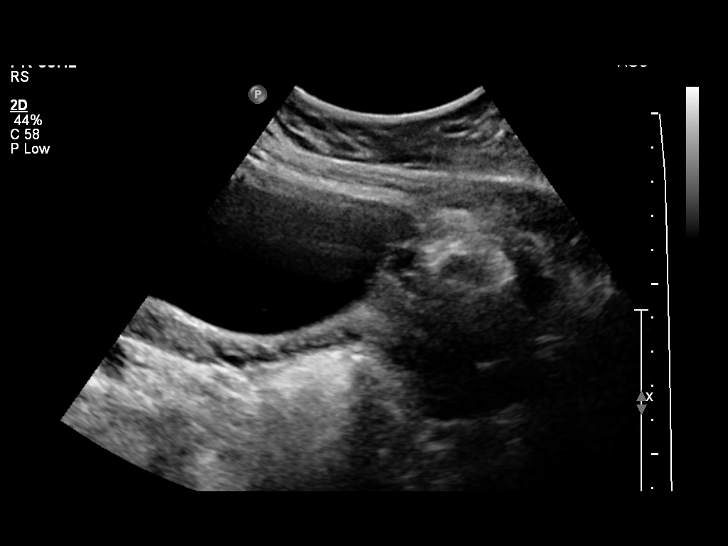
[im 8/70]
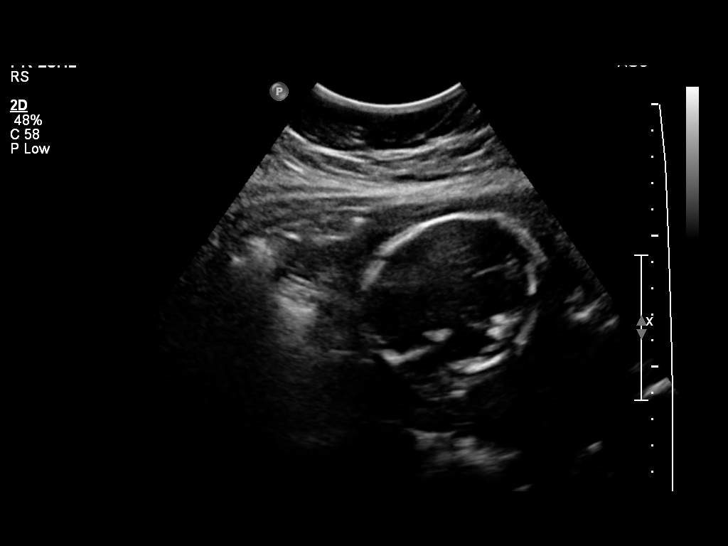
[im 13/70]
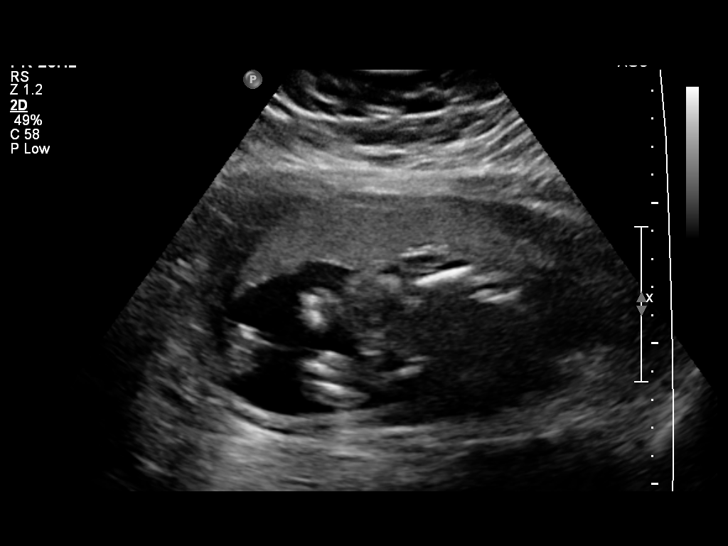
[im 21/70]
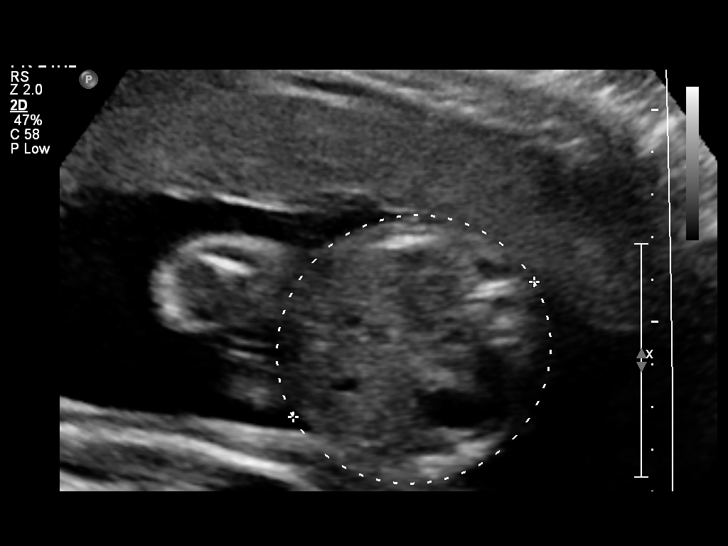
[im 26/70]
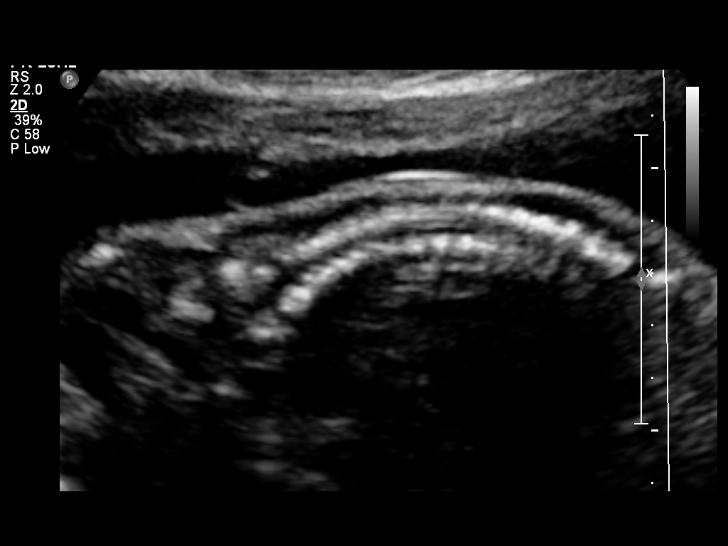
[im 31/70]
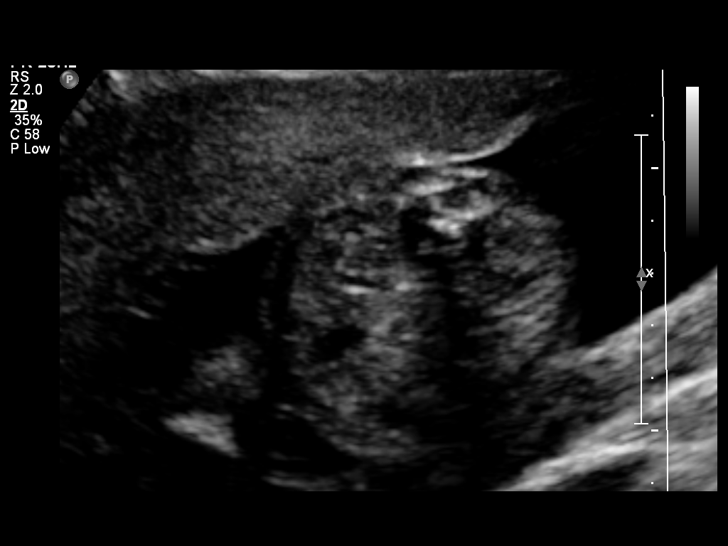
[im 39/70]
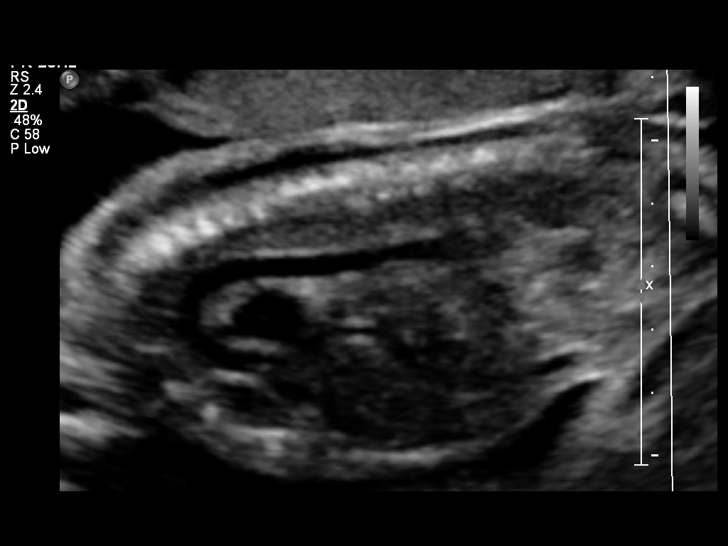
[im 44/70]
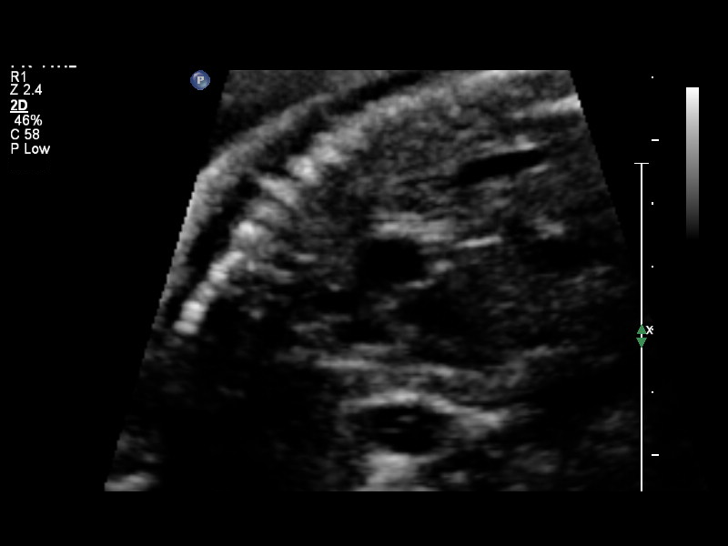
[im 49/70]
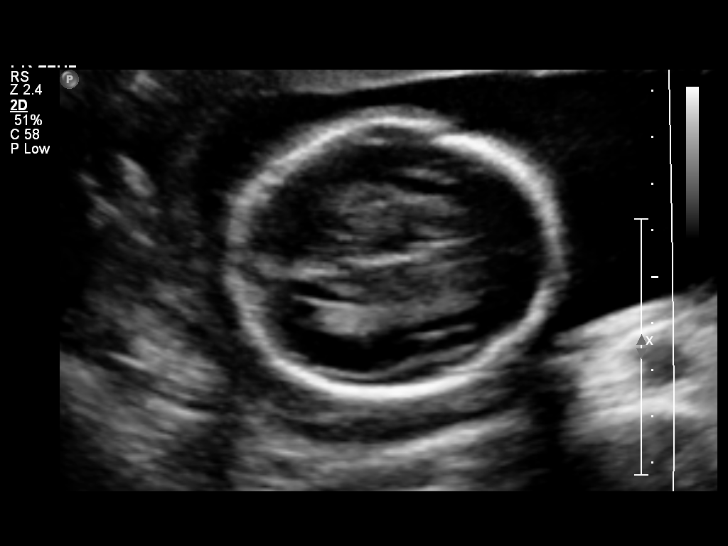
[im 57/70]
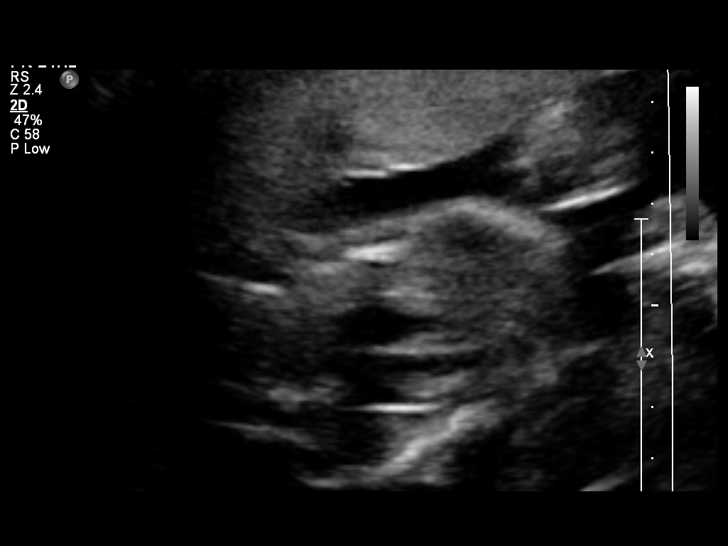
[im 62/70]
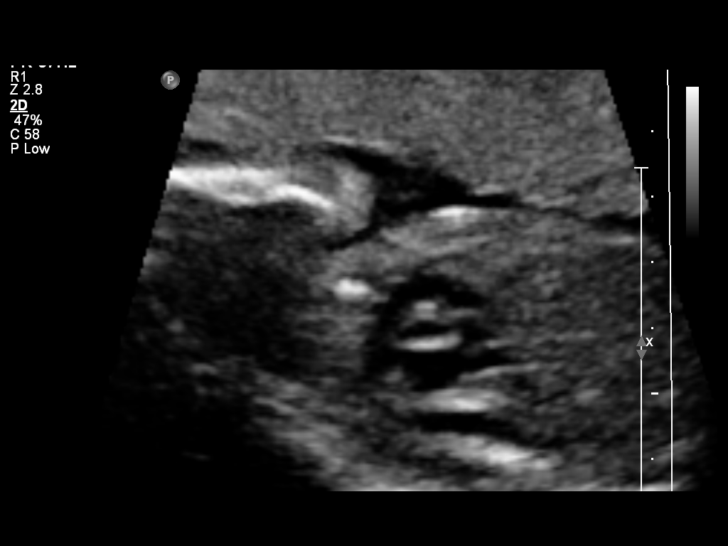
[im 67/70]
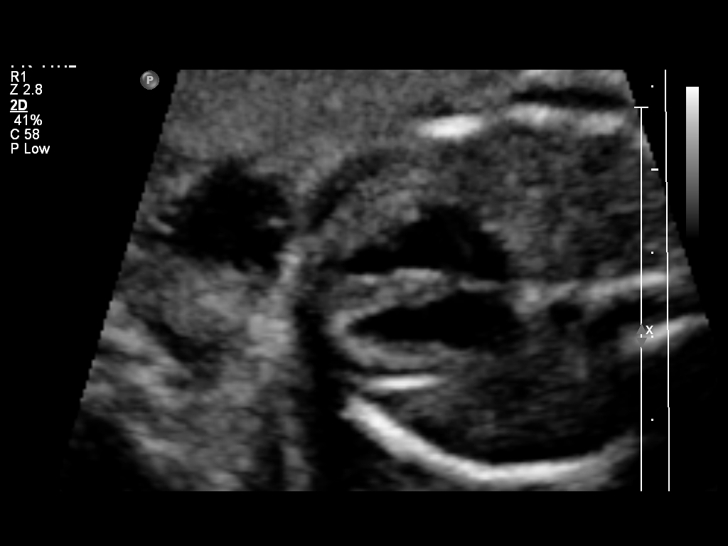

[12 of 28 positions shown; findings below may reference images not displayed]

OBSTETRICS REPORT
                      (Signed Final 05/12/2014 [DATE])

Service(s) Provided

 US OB FOLLOW UP                                       76816.1
Indications

 Obesity complicating pregnancy
 23 weeks gestation of pregnancy
Fetal Evaluation

 Num Of Fetuses:    1
 Fetal Heart Rate:  152                          bpm
 Cardiac Activity:  Observed
 Presentation:      Transverse, head to
                    maternal right
 Placenta:          Anterior, above cervical os
 P. Cord            Previously Visualized
 Insertion:

 Amniotic Fluid
 AFI FV:      Subjectively within normal limits
                                             Larg Pckt:     5.8  cm
Biometry

 BPD:     58.5  mm     G. Age:  24w 0d                CI:        76.19   70 - 86
                                                      FL/HC:      18.3   19.2 -

 HC:     212.4  mm     G. Age:  23w 2d       42  %    HC/AC:      1.07   1.05 -

 AC:     199.3  mm     G. Age:  24w 4d       83  %    FL/BPD:     66.3   71 - 87
 FL:      38.8  mm     G. Age:  22w 3d       19  %    FL/AC:      19.5   20 - 24

 Est. FW:     614  gm      1 lb 6 oz     61  %
Gestational Age

 LMP:           25w 4d        Date:  11/14/13                 EDD:   08/21/14
 U/S Today:     23w 4d                                        EDD:   09/04/14
 Best:          23w 1d     Det. By:  U/S (03/20/14)           EDD:   09/07/14
Anatomy

 Cranium:          Appears normal         Aortic Arch:      Appears normal
 Fetal Cavum:      Appears normal         Ductal Arch:      Appears normal
 Ventricles:       Appears normal         Diaphragm:        Previously seen
 Choroid Plexus:   Previously seen        Stomach:          Appears normal, left
                                                            sided
 Cerebellum:       Appears normal         Abdomen:          Appears normal
 Posterior Fossa:  Previously seen        Abdominal Wall:   Previously seen
 Nuchal Fold:      Previously seen        Cord Vessels:     Previously seen
 Face:             Orbits and profile     Kidneys:          Appear normal
                   previously seen
 Lips:             Previously seen        Bladder:          Appears normal
 Heart:            Appears normal         Spine:            Previously seen
                   (4CH, axis, and
                   situs)
 RVOT:             Appears normal         Lower             Previously seen
                                          Extremities:
 LVOT:             Appears normal         Upper             Previously seen
                                          Extremities:

 Other:  Male gender. Heels previously visualized. Technically difficult due to
         maternal habitus and fetal position.
Cervix Uterus Adnexa

 Cervical Length:    3.3      cm

 Cervix:       Normal appearance by transabdominal scan.

 Adnexa:     No abnormality visualized.
Impression

 Single IUP at 23w 1d
 Normal interval anatomy- the fetal anatomic survey is now
 complete
 Fetal growth is appropriate (61st %tile)
 Anterior placenta
 Normal amniotic fluid volume
Recommendations

 Follow-up ultrasounds as clinically indicated.

 questions or concerns.

## 2018-11-16 ENCOUNTER — Telehealth: Payer: Self-pay | Admitting: Family

## 2018-11-16 DIAGNOSIS — Z202 Contact with and (suspected) exposure to infections with a predominantly sexual mode of transmission: Secondary | ICD-10-CM

## 2018-11-16 NOTE — Progress Notes (Signed)
Based on what you shared with me, I feel your condition warrants further evaluation and I recommend that you be seen for a face to face office visit.     NOTE: If you entered your credit card information for this eVisit, you will not be charged. You may see a "hold" on your card for the $35 but that hold will drop off and you will not have a charge processed.  If you are having a true medical emergency please call 911.  If you need an urgent face to face visit, [facility] has four urgent care centers for your convenience.    PLEASE NOTE: THE [facility] LOCATIONS AND URGENT CARE CLINICS DO NOT HAVE THE TESTING FOR CORONAVIRUS COVID19 AVAILABLE.  IF YOU FEEL YOU NEED THIS TEST YOU MUST GO TO A TRIAGE LOCATION AT ONE OF THE HOSPITAL EMERGENCY DEPARTMENTS   [email or web address] to reserve your spot online an avoid wait times  [facility] [address], [address] [place], [place] [address] Modified hours of operation: Monday-Friday, 12 PM to 6 PM  Saturday & Sunday 10 AM to 4 PM *Across the street from [employer]  [facility] (New Address!) [address], [address] [place], [place] [address] *Just off [address], across the road from [employer]* Modified hours of operation: Monday-Friday, 12 PM to 6 PM  Closed Saturday & Sunday  [facility]'s modified hours of operation will be in effect from May 1 until May 31   The following sites will take your insurance:  . [facility]  [phone] Get Driving Directions Find a Provider at this Location  [address] [place], [place] [address] . 10 am to 8 pm Monday-Friday . 12 pm to 8 pm Saturday-Sunday   . [facility]  [phone] Get Driving Directions Find a Provider at this Location  [address], [address] [place], [place] [address] . 8 am to 8 pm Monday-Friday . 9 am to 6 pm Saturday . 11 am to 6 pm Sunday   . [facility] [place]  [phone] Get Driving Directions  [address] [place], [place] [address] . 8 am to 8 pm Monday-Friday . 8 am to 4 pm Saturday-Sunday   Your e-visit answers were reviewed by a board certified advanced clinical practitioner to complete your personal care plan.  Thank you for using e-Visits. 

## 2019-01-28 ENCOUNTER — Other Ambulatory Visit: Payer: Self-pay

## 2019-01-28 ENCOUNTER — Encounter (HOSPITAL_COMMUNITY): Payer: Self-pay

## 2019-01-28 ENCOUNTER — Ambulatory Visit (HOSPITAL_COMMUNITY)
Admission: EM | Admit: 2019-01-28 | Discharge: 2019-01-28 | Disposition: A | Discharge status: Home / Self Care | Payer: Self-pay | Attending: Emergency Medicine | Admitting: Emergency Medicine

## 2019-01-28 DIAGNOSIS — J029 Acute pharyngitis, unspecified: Secondary | ICD-10-CM

## 2019-01-28 LAB — POCT RAPID STREP A: Streptococcus, Group A Screen (Direct): NEGATIVE

## 2019-01-28 NOTE — ED Triage Notes (Signed)
Patient presents to Urgent Care with complaints of "lump" in throat since 3 weeks ago. Patient reports she does not have a problem swallowing, and discomfort is relieved when eating. Pt states it feels like she cannot burp.

## 2019-01-28 NOTE — Discharge Instructions (Signed)
Take Tylenol or Motrin as needed for discomfort.   Follow-up with your primary care provider if your symptoms persist.   To the emergency department if you develop difficulty swallowing or breathing.

## 2019-01-28 NOTE — ED Provider Notes (Signed)
[place]    CSN: [id number] Arrival date & time: 01/28/19  1657     History   Chief Complaint Chief Complaint  Patient presents with  . Sore Throat    HPI Carla Sutton is a 33 y.o. female.   She presents with a sore throat and feeling like she has a "lump" in her throat x3 weeks.  She denies difficulty swallowing or breathing.  She denies any injury.  She denies fever, chills, ear pain, cough, shortness of breath, abdominal pain, back pain, dysuria, or other symptoms.  LMP: 2 years, patient states she has not had a menstrual cycle since she stopped breast-feeding 2 years ago.    The history is provided by the patient.    Past Medical History:  Diagnosis Date  . Infection    UTI    Patient Active Problem List   Diagnosis Date Noted  . Active labor 09/03/2014  . Gestational edema in third trimester, antepartum 08/29/2014  . Supervision of normal first pregnancy 03/26/2014    Past Surgical History:  Procedure Laterality Date  . DILATION AND CURETTAGE OF UTERUS      OB History    Gravida  2   Para  1   Term  1   Preterm  0   AB  1   Living  1     SAB  1   TAB  0   Ectopic  0   Multiple  0   Live Births  1            Home Medications    Prior to Admission medications   Medication Sig Start Date End Date Taking? Authorizing Provider  cephALEXin (KEFLEX) 500 MG capsule Take 1 capsule (500 mg total) by mouth 4 (four) times daily. 11/29/14   Hedges, Dellis Filbert, PA-C  ibuprofen (ADVIL,MOTRIN) 600 MG tablet Take 1 tablet (600 mg total) by mouth every 6 (six) hours as needed. Patient taking differently: Take 600 mg by mouth every 6 (six) hours as needed for moderate pain.  09/04/14   Myrtis Ser, CNM    Family History Family History  Problem Relation Age of Onset  . Cancer Mother   . Diabetes Mother   . Heart disease Mother   . Hyperlipidemia Mother   . Hypertension Mother   . Hyperlipidemia Father   . Hypertension Father   .  Diabetes Father     Social History Social History   Tobacco Use  . Smoking status: Never Smoker  . Smokeless tobacco: Never Used  Substance Use Topics  . Alcohol use: No    Alcohol/week: 5.0 standard drinks    Types: 5 Standard drinks or equivalent per week  . Drug use: No     Allergies   Shellfish allergy   Review of Systems Review of Systems  Constitutional: Negative for chills and fever.  HENT: Positive for sore throat. Negative for congestion, ear pain, sinus pressure and trouble swallowing.   Eyes: Negative for pain and visual disturbance.  Respiratory: Negative for cough and shortness of breath.   Cardiovascular: Negative for chest pain and palpitations.  Gastrointestinal: Negative for abdominal pain and vomiting.  Genitourinary: Negative for dysuria and hematuria.  Musculoskeletal: Negative for arthralgias and back pain.  Skin: Negative for color change and rash.  Neurological: Negative for seizures and syncope.  All other systems reviewed and are negative.    Physical Exam Triage Vital Signs ED Triage Vitals  Enc Vitals Group  BP 01/28/19 1801 133/86     Pulse Rate 01/28/19 1801 99     Resp 01/28/19 1801 16     Temp 01/28/19 1801 99.1 F (37.3 C)     Temp Source 01/28/19 1801 Oral     SpO2 01/28/19 1801 100 %     Weight --      Height --      Head Circumference --      Peak Flow --      Pain Score 01/28/19 1759 0     Pain Loc --      Pain Edu? --      Excl. in GC? --    No data found.  Updated Vital Signs BP 133/86 (BP Location: Left Arm)   Pulse 99   Temp 99.1 F (37.3 C) (Oral)   Resp 16   SpO2 100%   Breastfeeding No Comment: "has been irregular since last child"  Visual Acuity Right Eye Distance:   Left Eye Distance:   Bilateral Distance:    Right Eye Near:   Left Eye Near:    Bilateral Near:     Physical Exam Vitals signs and nursing note reviewed.  Constitutional:      General: She is not in acute distress.     Appearance: She is well-developed.  HENT:     Head: Normocephalic and atraumatic.     Right Ear: Tympanic membrane normal.     Left Ear: Tympanic membrane normal.     Mouth/Throat:     Mouth: Mucous membranes are moist.     Pharynx: No pharyngeal swelling, oropharyngeal exudate or posterior oropharyngeal erythema.  Eyes:     Conjunctiva/sclera: Conjunctivae normal.  Neck:     Musculoskeletal: Neck supple.  Cardiovascular:     Rate and Rhythm: Normal rate and regular rhythm.  Pulmonary:     Effort: Pulmonary effort is normal. No respiratory distress.     Breath sounds: Normal breath sounds.  Abdominal:     Palpations: Abdomen is soft.     Tenderness: There is no abdominal tenderness.  Lymphadenopathy:     Cervical: No cervical adenopathy.  Skin:    General: Skin is warm and dry.  Neurological:     Mental Status: She is alert.      UC Treatments / Results  Labs (all labs ordered are listed, but only abnormal results are displayed) Labs Reviewed  CULTURE, GROUP A STREP [facility]  POCT RAPID STREP A    EKG   Radiology No results found.  Procedures Procedures (including critical care time)  Medications Ordered in UC Medications - No data to display  Initial Impression / Assessment and Plan / UC Course  I have reviewed the triage vital signs and the nursing notes.  Pertinent labs & imaging results that were available during my care of the patient were reviewed by me and considered in my medical decision making (see chart for details).   Sore throat.  Rapid strep negative; culture pending.  Discussed with patient that she should follow-up with her primary care provider if her symptoms persist.  Discussed that we would call her if her culture results come back positive and she needs additional treatment.  Strict instructions given to go to the emergency department if she develops difficulty swallowing or breathing.    Final Clinical Impressions(s) / UC Diagnoses    Final diagnoses:  Sore throat     Discharge Instructions     Take Tylenol or Motrin as needed for  discomfort.   Follow-up with your primary care provider if your symptoms persist.   To the emergency department if you develop difficulty swallowing or breathing.       ED Prescriptions    None     Controlled Substance Prescriptions [place] Controlled Substance Registry consulted? Not Applicable   Mickie Bailate, Vennie Salsbury H, NP 01/28/19 [phone]

## 2019-01-31 LAB — CULTURE, GROUP A STREP (THRC)

## 2023-08-16 NOTE — Progress Notes (Unsigned)
New patient visit   Patient: Carla Sutton   DOB: [birth year]   38 y.o. Female  MRN: [id number] Visit Date: 08/17/2023  Today's healthcare provider: Alfredia Ferguson, PA-C   No chief complaint on file.  Subjective    Carla Sutton is a 38 y.o. female who presents today as a new patient to establish care.   ***  Past Medical History:  Diagnosis Date   Infection    UTI   Past Surgical History:  Procedure Laterality Date   DILATION AND CURETTAGE OF UTERUS     Family Status  Relation Name Status   Mother  Deceased   Father  Alive   Sister 3 sisters Alive  No partnership data on file   Family History  Problem Relation Age of Onset   Cancer Mother    Diabetes Mother    Heart disease Mother    Hyperlipidemia Mother    Hypertension Mother    Hyperlipidemia Father    Hypertension Father    Diabetes Father    Social History   Socioeconomic History   Marital status: Single    Spouse name: Not on file   Number of children: Not on file   Years of education: Not on file   Highest education level: Not on file  Occupational History   Not on file  Tobacco Use   Smoking status: Never   Smokeless tobacco: Never  Vaping Use   Vaping status: Never Used  Substance and Sexual Activity   Alcohol use: No    Alcohol/week: 5.0 standard drinks of alcohol    Types: 5 Standard drinks or equivalent per week   Drug use: No   Sexual activity: Not Currently    Birth control/protection: None  Other Topics Concern   Not on file  Social History Narrative   Not on file   Social Drivers of Health   Financial Resource Strain: Not on file  Food Insecurity: Not on file  Transportation Needs: Not on file  Physical Activity: Not on file  Stress: Not on file  Social Connections: Not on file   Outpatient Medications Prior to Visit  Medication Sig   cephALEXin (KEFLEX) 500 MG capsule Take 1 capsule (500 mg total) by mouth 4 (four) times daily.   ibuprofen (ADVIL,MOTRIN) 600 MG tablet  Take 1 tablet (600 mg total) by mouth every 6 (six) hours as needed. (Patient taking differently: Take 600 mg by mouth every 6 (six) hours as needed for moderate pain. )   No facility-administered medications prior to visit.   Allergies  Allergen Reactions   Shellfish Allergy Itching    Shrimp "hands swollen and mouth gets itchy"     Immunization History  Administered Date(s) Administered   Influenza,inj,Quad PF,6+ Mos 04/02/2014   Tdap 07/02/2014    Health Maintenance  Topic Date Due   Hepatitis C Screening  Never done   Cervical Cancer Screening (HPV/Pap Cotest)  08/11/2017   INFLUENZA VACCINE  02/16/2023   DTaP/Tdap/Td (2 - Td or Tdap) 07/02/2024   COVID-19 Vaccine  Completed   HIV Screening  Completed   HPV VACCINES  Aged Out    Patient Care Team: Patient, No Pcp Per as PCP - General (General Practice)  Review of Systems  {Insert previous labs (optional):23779} {See past labs  Heme  Chem  Endocrine  Serology  Results Review (optional):1}   Objective    There were no vitals taken for this visit. {Insert last BP/Wt (optional):23777}{See vitals history (optional):1}  Physical Exam ***  Depression Screen     No data to display         No results found for any visits on 08/17/23.  Assessment & Plan     There are no diagnoses linked to this encounter.   No follow-ups on file.      Alfredia Ferguson, PA-C  [facility] [phone] (phone) [phone] (fax)  [facility]

## 2023-08-17 ENCOUNTER — Encounter: Payer: Self-pay | Admitting: Physician Assistant

## 2023-08-17 ENCOUNTER — Ambulatory Visit (INDEPENDENT_AMBULATORY_CARE_PROVIDER_SITE_OTHER): Payer: BC Managed Care – PPO | Admitting: Physician Assistant

## 2023-08-17 VITALS — BP 113/83 | HR 79 | Temp 98.3°F | Ht 62.0 in | Wt 211.5 lb

## 2023-08-17 DIAGNOSIS — F419 Anxiety disorder, unspecified: Secondary | ICD-10-CM | POA: Diagnosis not present

## 2023-08-17 DIAGNOSIS — Z1159 Encounter for screening for other viral diseases: Secondary | ICD-10-CM

## 2023-08-17 DIAGNOSIS — N926 Irregular menstruation, unspecified: Secondary | ICD-10-CM

## 2023-08-17 DIAGNOSIS — Z0000 Encounter for general adult medical examination without abnormal findings: Secondary | ICD-10-CM

## 2023-08-17 DIAGNOSIS — R0989 Other specified symptoms and signs involving the circulatory and respiratory systems: Secondary | ICD-10-CM

## 2023-08-17 DIAGNOSIS — Z124 Encounter for screening for malignant neoplasm of cervix: Secondary | ICD-10-CM

## 2023-08-17 DIAGNOSIS — Z8049 Family history of malignant neoplasm of other genital organs: Secondary | ICD-10-CM

## 2023-08-17 DIAGNOSIS — Z8349 Family history of other endocrine, nutritional and metabolic diseases: Secondary | ICD-10-CM | POA: Insufficient documentation

## 2023-08-17 DIAGNOSIS — M7989 Other specified soft tissue disorders: Secondary | ICD-10-CM | POA: Diagnosis not present

## 2023-08-17 HISTORY — DX: Irregular menstruation, unspecified: N92.6

## 2023-08-17 LAB — LIPID PANEL
Cholesterol: 215 mg/dL — ABNORMAL HIGH (ref 0–200)
HDL: 37.3 mg/dL — ABNORMAL LOW (ref >39.00)
LDL Cholesterol: 150 mg/dL — ABNORMAL HIGH (ref 0–99)
NonHDL: 178.18
Total CHOL/HDL Ratio: 6
Triglycerides: 142 mg/dL (ref 0.0–149.0)
VLDL: 28.4 mg/dL (ref 0.0–40.0)

## 2023-08-17 LAB — CBC WITH DIFFERENTIAL/PLATELET
Basophils Absolute: 0 10*3/uL (ref 0.0–0.1)
Basophils Relative: 0.5 % (ref 0.0–3.0)
Eosinophils Absolute: 0.2 10*3/uL (ref 0.0–0.7)
Eosinophils Relative: 3.9 % (ref 0.0–5.0)
HCT: 40.8 % (ref 36.0–46.0)
Hemoglobin: 13.4 g/dL (ref 12.0–15.0)
Lymphocytes Relative: 41.7 % (ref 12.0–46.0)
Lymphs Abs: 2 10*3/uL (ref 0.7–4.0)
MCHC: 32.9 g/dL (ref 30.0–36.0)
MCV: 92 fL (ref 78.0–100.0)
Monocytes Absolute: 0.5 10*3/uL (ref 0.1–1.0)
Monocytes Relative: 10 % (ref 3.0–12.0)
Neutro Abs: 2 10*3/uL (ref 1.4–7.7)
Neutrophils Relative %: 43.9 % (ref 43.0–77.0)
Platelets: 271 10*3/uL (ref 150.0–400.0)
RBC: 4.43 Mil/uL (ref 3.87–5.11)
RDW: 13.3 % (ref 11.5–15.5)
WBC: 4.7 10*3/uL (ref 4.0–10.5)

## 2023-08-17 LAB — TESTOSTERONE: Testosterone: 19.55 ng/dL (ref 15.00–40.00)

## 2023-08-17 LAB — COMPREHENSIVE METABOLIC PANEL
ALT: 15 U/L (ref 0–35)
AST: 18 U/L (ref 0–37)
Albumin: 4.3 g/dL (ref 3.5–5.2)
Alkaline Phosphatase: 50 U/L (ref 39–117)
BUN: 16 mg/dL (ref 6–23)
CO2: 27 meq/L (ref 19–32)
Calcium: 9.6 mg/dL (ref 8.4–10.5)
Chloride: 104 meq/L (ref 96–112)
Creatinine, Ser: 0.8 mg/dL (ref 0.40–1.20)
GFR: 95.06 mL/min (ref >60.00)
Glucose, Bld: 94 mg/dL (ref 70–99)
Potassium: 4.6 meq/L (ref 3.5–5.1)
Sodium: 141 meq/L (ref 135–145)
Total Bilirubin: 0.6 mg/dL (ref 0.2–1.2)
Total Protein: 7.6 g/dL (ref 6.0–8.3)

## 2023-08-17 LAB — HEMOGLOBIN A1C: Hgb A1c MFr Bld: 5.8 % (ref 4.6–6.5)

## 2023-08-17 LAB — TSH: TSH: 0.99 u[IU]/mL (ref 0.35–5.50)

## 2023-08-17 LAB — T4, FREE: Free T4: 1.11 ng/dL (ref 0.60–1.60)

## 2023-08-17 NOTE — Assessment & Plan Note (Signed)
Previously irregular menstrual cycles, now regular for over a year. - Order tests for testosterone and relevant hormones - Monitor menstrual cycle regularity

## 2023-08-17 NOTE — Assessment & Plan Note (Addendum)
Chronic anxiety with daily symptoms including nervousness, throat tightness, and difficulty swallowing. No prior therapy. Patient prefers non-medication approaches initially. Discussed therapy, lifestyle modifications.  Medication considered if anxiety becomes unmanageable. Advised pt to monitor for food related symptoms in regards to throat tightness

## 2023-08-18 ENCOUNTER — Encounter: Payer: Self-pay | Admitting: Physician Assistant

## 2023-08-21 LAB — PROLACTIN: Prolactin: 5.5 ng/mL

## 2023-08-21 LAB — 17-HYDROXYPROGESTERONE: 17-OH-Progesterone, LC/MS/MS: 9 ng/dL

## 2023-08-21 LAB — HEPATITIS C ANTIBODY: Hepatitis C Ab: REACTIVE — AB

## 2023-08-21 LAB — HCV RNA,QUANTITATIVE REAL TIME PCR
HCV Quantitative Log: <1.18 {Log}
HCV RNA, PCR, QN: <15 [IU]/mL

## 2024-01-17 NOTE — Progress Notes (Unsigned)
      Established patient visit   Patient: Carla Sutton   DOB: 1986/03/30   38 y.o. Female  MRN: [id number] Visit Date: 01/18/2024  Today's healthcare provider: Manuelita Flatness, PA-C   No chief complaint on file.  Subjective     ***  Medications: Outpatient Medications Prior to Visit  Medication Sig   amoxicillin (AMOXIL) 500 MG capsule Take 500 mg by mouth 2 (two) times daily.   cephALEXin  (KEFLEX ) 500 MG capsule Take 1 capsule (500 mg total) by mouth 4 (four) times daily.   ibuprofen  (ADVIL ,MOTRIN ) 600 MG tablet Take 1 tablet (600 mg total) by mouth every 6 (six) hours as needed. (Patient taking differently: Take 600 mg by mouth every 6 (six) hours as needed for moderate pain. )   No facility-administered medications prior to visit.    Review of Systems {Insert previous labs (optional):23779} {See past labs  Heme  Chem  Endocrine  Serology  Results Review (optional):1}   Objective    There were no vitals taken for this visit. {Insert last BP/Wt (optional):23777}{See vitals history (optional):1}  Physical Exam  ***  No results found for any visits on 01/18/24.  Assessment & Plan    There are no diagnoses linked to this encounter.  ***  No follow-ups on file.       Manuelita Flatness, PA-C  [facility] [phone] (phone) [phone] (fax)  [facility]

## 2024-01-18 ENCOUNTER — Encounter: Payer: Self-pay | Admitting: Physician Assistant

## 2024-01-18 ENCOUNTER — Ambulatory Visit (INDEPENDENT_AMBULATORY_CARE_PROVIDER_SITE_OTHER): Admitting: Physician Assistant

## 2024-01-18 ENCOUNTER — Ambulatory Visit: Payer: Self-pay | Admitting: Physician Assistant

## 2024-01-18 VITALS — BP 116/78 | HR 78 | Ht 62.0 in | Wt 211.2 lb

## 2024-01-18 DIAGNOSIS — R7989 Other specified abnormal findings of blood chemistry: Secondary | ICD-10-CM

## 2024-01-18 DIAGNOSIS — N915 Oligomenorrhea, unspecified: Secondary | ICD-10-CM

## 2024-01-18 DIAGNOSIS — R197 Diarrhea, unspecified: Secondary | ICD-10-CM | POA: Diagnosis not present

## 2024-01-18 LAB — COMPREHENSIVE METABOLIC PANEL WITH GFR
ALT: 10 U/L (ref 0–35)
AST: 12 U/L (ref 0–37)
Albumin: 4.3 g/dL (ref 3.5–5.2)
Alkaline Phosphatase: 40 U/L (ref 39–117)
BUN: 11 mg/dL (ref 6–23)
CO2: 28 meq/L (ref 19–32)
Calcium: 9.4 mg/dL (ref 8.4–10.5)
Chloride: 105 meq/L (ref 96–112)
Creatinine, Ser: 0.74 mg/dL (ref 0.40–1.20)
GFR: 104.08 mL/min (ref >60.00)
Glucose, Bld: 102 mg/dL — ABNORMAL HIGH (ref 70–99)
Potassium: 4.3 meq/L (ref 3.5–5.1)
Sodium: 139 meq/L (ref 135–145)
Total Bilirubin: 1.4 mg/dL — ABNORMAL HIGH (ref 0.2–1.2)
Total Protein: 7.4 g/dL (ref 6.0–8.3)

## 2024-01-18 LAB — CBC WITH DIFFERENTIAL/PLATELET
Basophils Absolute: 0 10*3/uL (ref 0.0–0.1)
Basophils Relative: 0.7 % (ref 0.0–3.0)
Eosinophils Absolute: 0.2 10*3/uL (ref 0.0–0.7)
Eosinophils Relative: 5.1 % — ABNORMAL HIGH (ref 0.0–5.0)
HCT: 40.7 % (ref 36.0–46.0)
Hemoglobin: 13.4 g/dL (ref 12.0–15.0)
Lymphocytes Relative: 30.9 % (ref 12.0–46.0)
Lymphs Abs: 1.5 10*3/uL (ref 0.7–4.0)
MCHC: 33 g/dL (ref 30.0–36.0)
MCV: 89.5 fl (ref 78.0–100.0)
Monocytes Absolute: 0.4 10*3/uL (ref 0.1–1.0)
Monocytes Relative: 7.9 % (ref 3.0–12.0)
Neutro Abs: 2.7 10*3/uL (ref 1.4–7.7)
Neutrophils Relative %: 55.4 % (ref 43.0–77.0)
Platelets: 245 10*3/uL (ref 150.0–400.0)
RBC: 4.54 Mil/uL (ref 3.87–5.11)
RDW: 12.8 % (ref 11.5–15.5)
WBC: 4.9 10*3/uL (ref 4.0–10.5)

## 2024-01-18 LAB — HCG, QUANTITATIVE, PREGNANCY: Quantitative HCG: 6.91 m[IU]/mL

## 2024-01-18 LAB — TSH: TSH: 1.25 u[IU]/mL (ref 0.35–5.50)

## 2024-01-19 LAB — HEPATITIS A ANTIBODY, IGM: Hep A IgM: NONREACTIVE

## 2024-01-24 ENCOUNTER — Other Ambulatory Visit (INDEPENDENT_AMBULATORY_CARE_PROVIDER_SITE_OTHER)

## 2024-01-24 ENCOUNTER — Emergency Department (HOSPITAL_BASED_OUTPATIENT_CLINIC_OR_DEPARTMENT_OTHER)
Admission: EM | Admit: 2024-01-24 | Discharge: 2024-01-24 | Disposition: A | Discharge status: Home / Self Care | Source: Ambulatory Visit | Attending: Emergency Medicine | Admitting: Emergency Medicine

## 2024-01-24 ENCOUNTER — Ambulatory Visit: Payer: Self-pay | Admitting: Physician Assistant

## 2024-01-24 ENCOUNTER — Other Ambulatory Visit: Payer: Self-pay

## 2024-01-24 ENCOUNTER — Encounter (HOSPITAL_BASED_OUTPATIENT_CLINIC_OR_DEPARTMENT_OTHER): Payer: Self-pay | Admitting: Emergency Medicine

## 2024-01-24 DIAGNOSIS — R002 Palpitations: Secondary | ICD-10-CM | POA: Diagnosis present

## 2024-01-24 DIAGNOSIS — R7989 Other specified abnormal findings of blood chemistry: Secondary | ICD-10-CM | POA: Diagnosis not present

## 2024-01-24 DIAGNOSIS — Z3490 Encounter for supervision of normal pregnancy, unspecified, unspecified trimester: Secondary | ICD-10-CM

## 2024-01-24 DIAGNOSIS — N915 Oligomenorrhea, unspecified: Secondary | ICD-10-CM

## 2024-01-24 LAB — HCG, QUANTITATIVE, PREGNANCY: Quantitative HCG: 307.49 m[IU]/mL

## 2024-01-24 MED ORDER — HYDROXYZINE HCL 25 MG PO TABS: 25.0000 mg | ORAL_TABLET | Freq: Four times a day (QID) | ORAL | 0 refills | Status: DC | PRN

## 2024-01-24 NOTE — ED Notes (Signed)
 Pt alert and oriented X 4 at the time of discharge. RR even and unlabored. No acute distress noted. Pt verbalized understanding of discharge instructions as discussed. Pt ambulatory to lobby at time of discharge.

## 2024-01-24 NOTE — ED Provider Notes (Signed)
 [place] [facility] Provider Note   CSN: [id number] Arrival date & time: 01/24/24  9044     Patient presents with: Palpitations   Carla Sutton is a 38 y.o. female.   Patient with history of anxiety presents to the emergency department today for evaluation of palpitations.  Patient states that since about 2021 she will have episodes of anxiety that are generally short-lived.  She will get palpitations, lightheaded, short of breath and these will go away after a few minutes.  This morning she woke up around 2 AM having a panic attack.  She had her typical symptoms.  However, they did not improve like they normally did.  She was up for several hours.  She was able to fall back asleep.  She feels like her heart is pounding now, but overall improved.  No current chest pain or shortness of breath.  Patient does endorse worsening stressors recently.  She had labs performed by her PCP about a week ago.  She had a borderline high pregnancy test.  This was repeated just prior to arrival.  She is waiting on the results.  She does report having positive pregnancy tests at home.  No history of hypertension, high cholesterol, diabetes.  No smoking history.        Prior to Admission medications   Medication Sig Start Date End Date Taking? Authorizing Provider  hydrOXYzine  (ATARAX ) 25 MG tablet Take 1 tablet (25 mg total) by mouth every 6 (six) hours as needed for anxiety. 01/24/24  Yes Desiderio Chew, PA-C    Allergies: Shellfish allergy    Review of Systems  Updated Vital Signs BP (!) 135/93 (BP Location: Left Arm)   Pulse 94   Temp 98.4 F (36.9 C) (Oral)   Resp 16   Wt 95.3 kg   LMP 12/14/2023 (Approximate)   SpO2 98%   BMI 38.41 kg/m   Physical Exam Vitals and nursing note reviewed.  Constitutional:      Appearance: She is well-developed. She is not diaphoretic.  HENT:     Head: Normocephalic and atraumatic.     Mouth/Throat:     Mouth: Mucous  membranes are not dry.  Eyes:     Conjunctiva/sclera: Conjunctivae normal.  Neck:     Vascular: Normal carotid pulses. No JVD.     Trachea: Trachea normal. No tracheal deviation.  Cardiovascular:     Rate and Rhythm: Normal rate and regular rhythm.     Pulses: No decreased pulses.          Radial pulses are 2+ on the right side and 2+ on the left side.     Heart sounds: Normal heart sounds, S1 normal and S2 normal. No murmur heard. Pulmonary:     Effort: Pulmonary effort is normal. No respiratory distress.     Breath sounds: No wheezing.  Chest:     Chest wall: No tenderness.  Abdominal:     General: Bowel sounds are normal.     Palpations: Abdomen is soft.     Tenderness: There is no abdominal tenderness. There is no guarding or rebound.  Musculoskeletal:        General: Normal range of motion.     Cervical back: Normal range of motion and neck supple. No muscular tenderness.  Skin:    General: Skin is warm and dry.     Coloration: Skin is not pale.  Neurological:     Mental Status: She is alert.  Psychiatric:  Attention and Perception: Attention normal.        Mood and Affect: Mood is anxious. Affect is tearful.        Speech: Speech normal.        Behavior: Behavior normal.     Comments: Patient becomes tearful when talking about recent stressors.     (all labs ordered are listed, but only abnormal results are displayed) Labs Reviewed - No data to display  EKG: EKG Interpretation Date/Time:  Wednesday January 24 2024 10:05:51 EDT Ventricular Rate:  99 PR Interval:  147 QRS Duration:  84 QT Interval:  360 QTC Calculation: 462 R Axis:   144  Text Interpretation: Sinus rhythm Right axis deviation No significant change since last tracing Confirmed by Dean Clarity [phone]) on 01/24/2024 10:06:37 AM  Radiology: No results found.   Procedures   Medications Ordered in the ED - No data to display  ED course  Patient seen and examined. History obtained  directly from patient. Work-up including labs, imaging, EKG ordered in triage, if performed, were reviewed.  Reviewed recent outpatient workup including lab work.  Pertinent results include CBC normal white cell and hemoglobin; CMP T. bili 1.4 otherwise unremarkable; TSH was normal; beta-hCG quant was minimally elevated.  She had this recollected today, currently awaiting results.  Labs/EKG: Independently reviewed and interpreted.  This included: EKG unremarkable  Imaging: None ordered  Medications/Fluids: None ordered  Most recent vital signs reviewed and are as follows: BP (!) 135/93 (BP Location: Left Arm)   Pulse 94   Temp 98.4 F (36.9 C) (Oral)   Resp 16   Wt 95.3 kg   LMP 12/14/2023 (Approximate)   SpO2 98%   BMI 38.41 kg/m   Initial impression: Palpitations, likely anxiety induced  Home treatment plan: Trial hydroxyzine  as needed  Return instructions discussed with patient: Return if patient develops persistent chest pain, shortness of breath, persistent palpitations, syncope.  Follow-up instructions discussed with patient: PCP in 1 week.  Discussed that she may be getting to the point where she needs additional help with her anxiety.  She states that she was referred to a therapist but has not yet established care.  She may benefit from other treatments as well.  Will give hydroxyzine  to use as needed here there.  Patient should hear back about her pregnancy test later today.  She has appropriate outpatient follow-up.  Do not feel that she requires repeat lab work.  Do not suspect ACS or PE given her current presentation, vital signs, EKG.                                  Medical Decision Making Risk Prescription drug management.   For this patient's complaint of SOB/palpitations, the following emergent conditions were considered on the differential diagnosis: acute coronary syndrome, pulmonary embolism, pneumothorax, myocarditis, pericardial tamponade, aortic dissection,  thoracic aortic aneurysm complication, esophageal perforation. These are all very unlikely given history and presentation today.  Other causes were also considered including: gastroesophageal reflux disease, musculoskeletal pain including costochondritis, pneumonia/pleurisy, herpes zoster, pericarditis.  In regards to possibility of ACS, patient has atypical features of pain, non-ischemic and unchanged EKG and negative troponin(s). Heart score was calculated to be 1.   In regards to possibility of PE, symptoms are atypical for PE and risk profile is low, making PE low likelihood. PERC negative.   The patient's vital signs, pertinent lab work and imaging were reviewed  and interpreted as discussed in the ED course. Hospitalization was considered for further testing, treatments, or serial exams/observation. However as patient is well-appearing, has a stable exam, and reassuring studies today, I do not feel that they warrant admission at this time. This plan was discussed with the patient who verbalizes agreement and comfort with this plan and seems reliable and able to return to the Emergency Department with worsening or changing symptoms.        Final diagnoses:  Palpitations    ED Discharge Orders          Ordered    hydrOXYzine  (ATARAX ) 25 MG tablet  Every 6 hours PRN        01/24/24 1045               Desiderio Chew, PA-C 01/24/24 1053    Dean Clarity, MD 01/24/24 1124

## 2024-01-24 NOTE — ED Triage Notes (Signed)
 Reports woke up at 2 am with palpitation , denies chest pain or shortness of breath .  Hx anxiety feelings .

## 2024-01-24 NOTE — Discharge Instructions (Signed)
 Please read and follow all provided instructions.  Your diagnoses today include:  1. Palpitations    Tests performed today include: EKG -did not show any abnormal heart rhythms Vital signs. See below for your results today.   Medications prescribed:  Hydroxyzine  - antihistamine  You can find this medication over-the-counter.   This medication will make you drowsy. DO NOT drive or perform any activities that require you to be awake and alert if taking this.  Take any prescribed medications only as directed.  Home care instructions:  Follow any educational materials contained in this packet.  BE VERY CAREFUL not to take multiple medicines containing Tylenol  (also called acetaminophen ). Doing so can lead to an overdose which can damage your liver and cause liver failure and possibly death.   Follow-up instructions: Please follow-up with your primary care provider in the next 7 days for further evaluation of your symptoms.   Return instructions:  Please return to the Emergency Department if you experience worsening symptoms.  Please return if you have chest pain, shortness of breath, have an episode where you pass out Please return if you have any other emergent concerns.  Additional Information:  Your vital signs today were: BP (!) 135/93 (BP Location: Left Arm)   Pulse 94   Temp 98.4 F (36.9 C) (Oral)   Resp 16   Wt 95.3 kg   LMP 12/14/2023 (Approximate)   SpO2 98%   BMI 38.41 kg/m  If your blood pressure (BP) was elevated above 135/85 this visit, please have this repeated by your doctor within one month. --------------

## 2024-01-25 ENCOUNTER — Ambulatory Visit (INDEPENDENT_AMBULATORY_CARE_PROVIDER_SITE_OTHER): Admitting: Physician Assistant

## 2024-01-25 ENCOUNTER — Encounter: Payer: Self-pay | Admitting: Physician Assistant

## 2024-01-25 VITALS — BP 108/78 | HR 90 | Ht 62.0 in | Wt 209.8 lb

## 2024-01-25 DIAGNOSIS — F419 Anxiety disorder, unspecified: Secondary | ICD-10-CM | POA: Diagnosis not present

## 2024-01-25 DIAGNOSIS — R002 Palpitations: Secondary | ICD-10-CM | POA: Diagnosis not present

## 2024-01-25 DIAGNOSIS — Z3490 Encounter for supervision of normal pregnancy, unspecified, unspecified trimester: Secondary | ICD-10-CM

## 2024-01-25 NOTE — Progress Notes (Signed)
      Established patient visit   Patient: Carla Sutton   DOB: 03-Apr-1986   38 y.o. Female  MRN: [id number] Visit Date: 01/25/2024  Today's healthcare provider: Manuelita Flatness, PA-C   Cc. Pregnancy, palpitations  Subjective     Pt was seen at ED 7/9 for palpitations, EKG normal, thought 2/2 to anxiety. She was prescribed hydroxyzine  but has not taken it for pregnancy concerns.   She reports she has been anxious, and does identify the Sob/palpitation event as a panic attack.   She is feeling better today.   Medications: Outpatient Medications Prior to Visit  Medication Sig   hydrOXYzine  (ATARAX ) 25 MG tablet Take 1 tablet (25 mg total) by mouth every 6 (six) hours as needed for anxiety. (Patient not taking: Reported on 01/25/2024)   No facility-administered medications prior to visit.    Review of Systems  Constitutional:  Negative for fatigue and fever.  Respiratory:  Negative for cough and shortness of breath.   Cardiovascular:  Positive for palpitations. Negative for chest pain and leg swelling.  Gastrointestinal:  Negative for abdominal pain.  Neurological:  Negative for dizziness and headaches.       Objective    BP 108/78   Pulse 90   Ht 5' 2 (1.575 m)   Wt 209 lb 12.8 oz (95.2 kg)   LMP 12/14/2023 (Approximate)   BMI 38.37 kg/m    Physical Exam Constitutional:      General: She is awake.     Appearance: She is well-developed.  HENT:     Head: Normocephalic.  Eyes:     Conjunctiva/sclera: Conjunctivae normal.  Cardiovascular:     Rate and Rhythm: Normal rate and regular rhythm.     Heart sounds: Normal heart sounds.  Pulmonary:     Effort: Pulmonary effort is normal.  Skin:    General: Skin is warm.  Neurological:     Mental Status: She is alert and oriented to person, place, and time.  Psychiatric:        Attention and Perception: Attention normal.        Mood and Affect: Mood normal.        Speech: Speech normal.        Behavior: Behavior is  cooperative.      No results found for any visits on 01/25/24.  Assessment & Plan    Anxiety Palpitations Symptoms initially unrecognized as anxiety-related. - Consider heart monitor if palpitations persist  - Avoid hydroxyzine  during pregnancy--reviewed on uptodate, not recommended.   Unexpected pregnancy Likely early pregnancy, based on bhcg. Pt has upcoming appt with gyn.  Processing emotionally with no significant pregnancy-related symptoms currently.   Return if symptoms worsen or fail to improve.      Manuelita Flatness, PA-C  [facility] [phone] (phone) [phone] (fax)  [facility]

## 2024-01-26 LAB — CLOSTRIDIUM DIFFICILE BY PCR: Toxigenic C. Difficile by PCR: NEGATIVE

## 2024-01-29 LAB — STOOL CULTURE: E coli, Shiga toxin Assay: NEGATIVE

## 2024-02-07 ENCOUNTER — Other Ambulatory Visit: Payer: Self-pay | Admitting: Medical Genetics

## 2024-02-13 ENCOUNTER — Ambulatory Visit: Payer: BC Managed Care – PPO | Admitting: Physician Assistant

## 2024-02-15 ENCOUNTER — Other Ambulatory Visit (HOSPITAL_COMMUNITY)
Admission: RE | Admit: 2024-02-15 | Discharge: 2024-02-15 | Disposition: A | Discharge status: Home / Self Care | Source: Ambulatory Visit | Attending: Family Medicine | Admitting: Family Medicine

## 2024-02-15 ENCOUNTER — Other Ambulatory Visit: Payer: Self-pay

## 2024-02-15 ENCOUNTER — Ambulatory Visit

## 2024-02-15 VITALS — BP 137/87 | HR 81 | Wt 207.0 lb

## 2024-02-15 DIAGNOSIS — O0990 Supervision of high risk pregnancy, unspecified, unspecified trimester: Secondary | ICD-10-CM | POA: Insufficient documentation

## 2024-02-15 LAB — CERVICOVAGINAL ANCILLARY ONLY
Chlamydia: NEGATIVE
Comment: NEGATIVE
Comment: NORMAL
Neisseria Gonorrhea: NEGATIVE

## 2024-02-15 NOTE — Progress Notes (Signed)
 New OB Intake  I explained I am completing New OB Intake today. We discussed EDD of 09/15/2024, by Last Menstrual Period. Pt is G3P1011. I reviewed her allergies, medications and Medical/Surgical/OB history.    Patient Active Problem List   Diagnosis Date Noted   Supervision of high risk pregnancy, antepartum 02/15/2024   Anxiety 08/17/2023   Abnormal menstrual cycle 08/17/2023   Family history of thyroid disease 08/17/2023    Concerns addressed today  Patient informed that the ultrasound is considered a limited obstetric ultrasound and is not intended to be a complete ultrasound exam.  Patient also informed that the ultrasound is not being completed with the intent of assessing for fetal or placental anomalies or any pelvic abnormalities. Explained that the purpose of today's ultrasound is to assess for viability.  Patient acknowledges the purpose of the exam and the limitations of the study.     Delivery Plans Plans to deliver at [facility]. Discussed the nature of our practice with multiple providers including residents and students. Due to the size of the practice, the delivering provider may not be the same as those providing prenatal care.   MyChart/Babyscripts MyChart access verified. I explained pt will have some visits in office and some virtually. Babyscripts app discussed and ordered.   Blood Pressure Cuff Blood pressure cuff discussedDiscussed to be used for virtual visits and or if needed BP checks weekly.  Anatomy US  Explained first scheduled US  will be around 19 weeks.   Last Pap No results found for: DIAGPAP  First visit review I reviewed new OB appt with patient. Explained pt will be seen by Camie Rote at first visit. Discussed [place] genetic screening with patient and significant. Routine prenatal labs ordered.    Erminio DELENA Rumps, [place] 02/15/2024  10:02 AM

## 2024-02-16 LAB — CBC/D/PLT+RPR+RH+ABO+RUBIGG...
Antibody Screen: NEGATIVE
Basophils Absolute: 0 x10E3/uL (ref 0.0–0.2)
Basos: 0 %
EOS (ABSOLUTE): 0.2 x10E3/uL (ref 0.0–0.4)
Eos: 3 %
HCV Ab: NONREACTIVE
HIV Screen 4th Generation wRfx: NONREACTIVE
Hematocrit: 41.8 % (ref 34.0–46.6)
Hemoglobin: 13.6 g/dL (ref 11.1–15.9)
Hepatitis B Surface Ag: NEGATIVE
Immature Grans (Abs): 0 x10E3/uL (ref 0.0–0.1)
Immature Granulocytes: 0 %
Lymphocytes Absolute: 1.6 x10E3/uL (ref 0.7–3.1)
Lymphs: 27 %
MCH: 30.5 pg (ref 26.6–33.0)
MCHC: 32.5 g/dL (ref 31.5–35.7)
MCV: 94 fL (ref 79–97)
Monocytes Absolute: 0.4 x10E3/uL (ref 0.1–0.9)
Monocytes: 6 %
Neutrophils Absolute: 3.9 x10E3/uL (ref 1.4–7.0)
Neutrophils: 64 %
Platelets: 245 x10E3/uL (ref 150–450)
RBC: 4.46 x10E6/uL (ref 3.77–5.28)
RDW: 12.7 % (ref 11.7–15.4)
RPR Ser Ql: NONREACTIVE
Rh Factor: POSITIVE
Rubella Antibodies, IGG: 3.06 {index} (ref >0.99)
WBC: 6.1 x10E3/uL (ref 3.4–10.8)

## 2024-02-16 LAB — HCV INTERPRETATION

## 2024-02-17 LAB — URINE CULTURE, OB REFLEX

## 2024-02-17 LAB — CULTURE, OB URINE

## 2024-02-21 ENCOUNTER — Other Ambulatory Visit

## 2024-02-21 ENCOUNTER — Other Ambulatory Visit: Payer: Self-pay

## 2024-02-21 DIAGNOSIS — O0990 Supervision of high risk pregnancy, unspecified, unspecified trimester: Secondary | ICD-10-CM

## 2024-02-21 DIAGNOSIS — O0991 Supervision of high risk pregnancy, unspecified, first trimester: Secondary | ICD-10-CM

## 2024-02-21 DIAGNOSIS — Z3A08 8 weeks gestation of pregnancy: Secondary | ICD-10-CM

## 2024-02-23 ENCOUNTER — Ambulatory Visit: Payer: Self-pay | Admitting: Family Medicine

## 2024-02-23 DIAGNOSIS — O0990 Supervision of high risk pregnancy, unspecified, unspecified trimester: Secondary | ICD-10-CM

## 2024-02-26 ENCOUNTER — Other Ambulatory Visit (HOSPITAL_COMMUNITY)

## 2024-03-07 NOTE — Progress Notes (Signed)
 History:   Carla Sutton is a 38 y.o. G3P1011 at [redacted]w[redacted]d by LMP, early ultrasound being seen today for her first obstetrical visit.  Her obstetrical history is significant for advanced maternal age. Patient does intend to breast feed. Pregnancy history fully reviewed.  Patient reports nausea.      HISTORY: OB History  Gravida Para Term Preterm AB Living  3 1 1  0 1 1  SAB IAB Ectopic Multiple Live Births  1 0 0 0 1    # Outcome Date GA Lbr Len/2nd Weight Sex Type Anes PTL Lv  3 Current           2 Term 09/03/14 104w3d 03:36 / 03:25 6 lb 7.2 oz (2.925 kg) M Vag-Spont None  LIV     Birth Comments: Hgb, Normal, FA Newborn Screen Barcode: [id number] Date Collected: [id number]      Apgar1: 9  Apgar5: 9  1 SAB 2013            Last pap smear was done 2010 and was normal  Past Medical History:  Diagnosis Date   Infection    UTI   Past Surgical History:  Procedure Laterality Date   DILATION AND CURETTAGE OF UTERUS     Family History  Problem Relation Age of Onset   Cancer Mother        cervical cancer   Diabetes Mother    Heart disease Mother    Hyperlipidemia Mother    Hypertension Mother    Thyroid disease Mother    Hyperlipidemia Father    Hypertension Father    Diabetes Father    Social History   Tobacco Use   Smoking status: Never   Smokeless tobacco: Never  Vaping Use   Vaping status: Never Used  Substance Use Topics   Alcohol use: No    Alcohol/week: 5.0 standard drinks of alcohol    Types: 5 Standard drinks or equivalent per week   Drug use: No   Allergies  Allergen Reactions   Shellfish Allergy Itching    Shrimp hands swollen and mouth gets itchy    Current Outpatient Medications on File Prior to Visit  Medication Sig Dispense Refill   Prenatal Vit-Fe Fumarate-FA (PRENATAL VITAMINS PO) Take 1 capsule by mouth daily.     hydrOXYzine  (ATARAX ) 25 MG tablet Take 1 tablet (25 mg total) by mouth every 6 (six) hours as needed for anxiety. (Patient not  taking: Reported on 02/15/2024) 15 tablet 0   No current facility-administered medications on file prior to visit.    Review of Systems Pertinent items noted in HPI and remainder of comprehensive ROS otherwise negative. Physical Exam:   Vitals:   03/12/24 0926  BP: 109/68  Pulse: 78  Weight: 205 lb (93 kg)   Fetal Heart Rate (bpm): 159  Constitutional: Well-developed, well-nourished pregnant female in no acute distress.  HEENT: PERRLA Skin: normal color and turgor, no rash Cardiovascular: normal rate & rhythm, warm and well perfused Respiratory: normal effort, no problems with respiration noted GI: Abd soft, non-distended MS: Extremities nontender, no edema, normal ROM Neurologic: Alert and oriented x 4.  GU: no CVA tenderness Pelvic: Exam deferred. Pap postpartum  Assessment:    Pregnancy: G3P1011 Patient Active Problem List   Diagnosis Date Noted   Supervision of high risk pregnancy, antepartum 02/15/2024   Anxiety 08/17/2023   Abnormal menstrual cycle 08/17/2023   Family history of thyroid disease 08/17/2023     Plan:    1. Supervision of high  risk pregnancy, antepartum (Primary) - Doing well, not yet perceiving fetal movement.   2. [redacted] weeks gestation of pregnancy - Anticipatory guidance regarding upcoming care.   3. Pelvic pressure in pregnancy - Will perform self-swab today, treat based on results. - Refer to PFT  4. Nausea in pregnancy - Discussion of natural options for nausea management d/w patient. Patient states coping well, but welcomed natural options. Knows she can reach out with concerns or needs.     - Initial labs drawn. - Continue prenatal vitamins. - Problem list reviewed and updated. - Genetic Screening discussed, First trimester screen, Quad screen, and NIPS: requested. - Ultrasound discussed; fetal anatomic survey: requested. - Anticipatory guidance about prenatal visits given including labs, ultrasounds, and testing. - Discussed usage  of Babyscripts and virtual visits as additional source of managing and completing prenatal visits in midst of coronavirus and pandemic.   - Encouraged to complete MyChart Registration for her ability to review results, send requests, and have questions addressed.  - The nature of [place] [facility] Practice with multiple MDs and Advanced Practice Providers was explained to patient; also emphasized that residents, students are part of our team. - Routine obstetric precautions reviewed. Encouraged to seek out care at office or emergency room [facility] preferred) for urgent and/or emergent concerns.  Return in about 4 weeks (around 04/09/2024) for LOB.    Future Appointments  Date Time Provider Department Center  03/25/2024  9:00 AM [facility] None  03/26/2024 11:00 AM [facility] LAB [facility] None  04/09/2024  4:10 PM Synthia Raisin, CNM [facility] None  05/07/2024  8:35 AM Synthia Raisin, CNM [facility] None    Camie Rote, MSN, CNM, RNC-OB Certified Nurse Midwife, [facility] 03/12/2024 1:15 PM

## 2024-03-12 ENCOUNTER — Ambulatory Visit: Admitting: Certified Nurse Midwife

## 2024-03-12 ENCOUNTER — Other Ambulatory Visit (HOSPITAL_COMMUNITY)
Admission: RE | Admit: 2024-03-12 | Discharge: 2024-03-12 | Disposition: A | Discharge status: Home / Self Care | Source: Ambulatory Visit | Attending: Certified Nurse Midwife | Admitting: Certified Nurse Midwife

## 2024-03-12 VITALS — BP 109/68 | HR 78 | Wt 205.0 lb

## 2024-03-12 DIAGNOSIS — Z3A00 Weeks of gestation of pregnancy not specified: Secondary | ICD-10-CM | POA: Diagnosis not present

## 2024-03-12 DIAGNOSIS — O0990 Supervision of high risk pregnancy, unspecified, unspecified trimester: Secondary | ICD-10-CM

## 2024-03-12 DIAGNOSIS — O26899 Other specified pregnancy related conditions, unspecified trimester: Secondary | ICD-10-CM | POA: Insufficient documentation

## 2024-03-12 DIAGNOSIS — R110 Nausea: Secondary | ICD-10-CM

## 2024-03-12 DIAGNOSIS — R102 Pelvic and perineal pain: Secondary | ICD-10-CM | POA: Diagnosis present

## 2024-03-12 DIAGNOSIS — Z3A10 10 weeks gestation of pregnancy: Secondary | ICD-10-CM

## 2024-03-12 DIAGNOSIS — Z124 Encounter for screening for malignant neoplasm of cervix: Secondary | ICD-10-CM

## 2024-03-14 LAB — CERVICOVAGINAL ANCILLARY ONLY
Bacterial Vaginitis (gardnerella): POSITIVE — AB
Candida Glabrata: NEGATIVE
Candida Vaginitis: NEGATIVE
Chlamydia: NEGATIVE
Comment: NEGATIVE
Comment: NEGATIVE
Comment: NEGATIVE
Comment: NEGATIVE
Comment: NEGATIVE
Comment: NORMAL
Neisseria Gonorrhea: NEGATIVE
Trichomonas: NEGATIVE

## 2024-03-15 ENCOUNTER — Ambulatory Visit: Admitting: Physical Therapy

## 2024-03-18 ENCOUNTER — Ambulatory Visit: Payer: Self-pay | Admitting: Certified Nurse Midwife

## 2024-03-18 DIAGNOSIS — B9689 Other specified bacterial agents as the cause of diseases classified elsewhere: Secondary | ICD-10-CM

## 2024-03-18 MED ORDER — METRONIDAZOLE 0.75 % VA GEL: 1.0000 | Freq: Every day | VAGINAL | 1 refills | Status: DC

## 2024-03-18 NOTE — Progress Notes (Signed)
 Patient with positive BV on wet prep. Prescription as below and MyChart message sent to patient.  1. Bacterial vaginosis (Primary) - metroNIDAZOLE  (METROGEL ) 0.75 % vaginal gel; Place 1 Applicatorful vaginally at bedtime. Apply one applicatorful to vagina at bedtime for 5 days  Dispense: 70 g; Refill: 1  Camie Rote, MSN, CNM, RNC-OB Certified Nurse Midwife, [facility] 03/18/2024 4:24 PM

## 2024-03-25 ENCOUNTER — Other Ambulatory Visit (HOSPITAL_COMMUNITY)

## 2024-03-26 ENCOUNTER — Other Ambulatory Visit

## 2024-03-26 DIAGNOSIS — O0992 Supervision of high risk pregnancy, unspecified, second trimester: Secondary | ICD-10-CM

## 2024-03-26 DIAGNOSIS — O0990 Supervision of high risk pregnancy, unspecified, unspecified trimester: Secondary | ICD-10-CM

## 2024-03-27 LAB — HEMOGLOBIN A1C
Est. average glucose Bld gHb Est-mCnc: 108 mg/dL
Hgb A1c MFr Bld: 5.4 % (ref 4.8–5.6)

## 2024-03-29 ENCOUNTER — Ambulatory Visit: Payer: Self-pay | Admitting: Obstetrics and Gynecology

## 2024-03-29 DIAGNOSIS — O0990 Supervision of high risk pregnancy, unspecified, unspecified trimester: Secondary | ICD-10-CM

## 2024-03-31 LAB — PANORAMA PRENATAL TEST FULL PANEL:PANORAMA TEST PLUS 5 ADDITIONAL MICRODELETIONS: FETAL FRACTION: 5.2

## 2024-04-06 LAB — HORIZON CUSTOM: REPORT SUMMARY: NEGATIVE

## 2024-04-09 ENCOUNTER — Ambulatory Visit (INDEPENDENT_AMBULATORY_CARE_PROVIDER_SITE_OTHER)

## 2024-04-09 VITALS — BP 123/73 | HR 88 | Wt 205.0 lb

## 2024-04-09 DIAGNOSIS — O0990 Supervision of high risk pregnancy, unspecified, unspecified trimester: Secondary | ICD-10-CM

## 2024-04-09 DIAGNOSIS — Z3A14 14 weeks gestation of pregnancy: Secondary | ICD-10-CM | POA: Diagnosis not present

## 2024-04-09 DIAGNOSIS — O99210 Obesity complicating pregnancy, unspecified trimester: Secondary | ICD-10-CM | POA: Diagnosis not present

## 2024-04-09 MED ORDER — ASPIRIN 81 MG PO TBEC: 81.0000 mg | DELAYED_RELEASE_TABLET | Freq: Every day | ORAL | 2 refills | Status: DC

## 2024-04-09 NOTE — Progress Notes (Signed)
   HIGH-RISK PREGNANCY OFFICE VISIT  Patient name: Carla Sutton MRN [id number]  Date of birth: [birth year] Chief Complaint:   Routine Prenatal Visit  Subjective:   Carla Sutton is a 38 y.o. G47P1011 female at [redacted]w[redacted]d with an Estimated Date of Delivery: 10/02/24 being seen today for ongoing management of a high-risk pregnancy aeb has Anxiety; Abnormal menstrual cycle; Family history of thyroid disease; Supervision of high risk pregnancy, antepartum; and Obesity in pregnancy, antepartum on their problem list.  Patient presents today, alone, with no complaints.  Patient endorses fetal movement. Patient denies abdominal cramping or contractions.  Patient denies vaginal concerns including abnormal discharge, leaking of fluid, and bleeding. No issues with urination, constipation, or diarrhea.    Contractions: Not present.  .  Movement: Absent.  Reviewed past medical,surgical, social, obstetrical and family history as well as problem list, medications and allergies.  Objective   Vitals:   04/09/24 1556  BP: 123/73  Pulse: 88  Weight: 205 lb (93 kg)  Body mass index is 37.49 kg/m.  Total Weight Gain:-2 lb (-0.907 kg)         Physical Examination:   General appearance: Well appearing, and in no distress  Mental status: Alert, oriented to person, place, and time  Skin: Warm & dry  Cardiovascular: Normal heart rate noted  Respiratory: Normal respiratory effort, no distress  Abdomen: Not assessed  Pelvic: Cervical exam deferred           Extremities: Edema: None  Fetal Status: Fetal Heart Rate (bpm): 146  Movement: Absent   No results found for this or any previous visit (from the past 24 hours).   Assessment & Plan   High-risk pregnancy of a 38 y.o., G3P1011 at [redacted]w[redacted]d with an Estimated Date of Delivery: 10/02/24   1. Supervision of high risk pregnancy, antepartum -Anticipatory guidance for upcoming appts. -Patient to schedule next appt in 4 weeks weeks for a in-person visit.   2. [redacted] weeks  gestation of pregnancy -Doing well -Reviewed labs.   3. Obesity in pregnancy, antepartum - Not prescribed.  -Reviewed PreEclampsia risk factors including Hispanic Ethnicity and BMI >30.   -Reviewed research and findings supporting initiation of bASA to reduce risk of developing condition later in pregnancy.  Patient verbalizes understanding and agrees to plan. -Script sent today.       Meds:  Meds ordered this encounter  Medications   aspirin  EC 81 MG tablet    Sig: Take 1 tablet (81 mg total) by mouth daily.    Dispense:  60 tablet    Refill:  2    Supervising Provider:   PRATT, TANYA S [[id number]]   Labs/procedures today:  Lab Orders  No laboratory test(s) ordered today     Reviewed: Preterm labor symptoms and general obstetric precautions including but not limited to vaginal bleeding, contractions, leaking of fluid and fetal movement were reviewed in detail with the patient.  All questions were answered.  Follow-up: No follow-ups on file.  No orders of the defined types were placed in this encounter.  Harlene LITTIE Duncans MSN, CNM 04/09/2024

## 2024-05-01 DIAGNOSIS — O09529 Supervision of elderly multigravida, unspecified trimester: Secondary | ICD-10-CM | POA: Insufficient documentation

## 2024-05-02 ENCOUNTER — Other Ambulatory Visit: Payer: Self-pay | Admitting: Medical Genetics

## 2024-05-07 ENCOUNTER — Encounter

## 2024-05-09 ENCOUNTER — Ambulatory Visit: Admitting: Family Medicine

## 2024-05-09 ENCOUNTER — Other Ambulatory Visit (HOSPITAL_BASED_OUTPATIENT_CLINIC_OR_DEPARTMENT_OTHER): Payer: Self-pay

## 2024-05-09 VITALS — BP 116/74 | HR 85 | Wt 205.0 lb

## 2024-05-09 DIAGNOSIS — O0990 Supervision of high risk pregnancy, unspecified, unspecified trimester: Secondary | ICD-10-CM

## 2024-05-09 DIAGNOSIS — O99210 Obesity complicating pregnancy, unspecified trimester: Secondary | ICD-10-CM

## 2024-05-09 DIAGNOSIS — O09522 Supervision of elderly multigravida, second trimester: Secondary | ICD-10-CM | POA: Diagnosis not present

## 2024-05-09 DIAGNOSIS — Z3A19 19 weeks gestation of pregnancy: Secondary | ICD-10-CM

## 2024-05-09 MED ORDER — ASPIRIN 81 MG PO TBEC
81.0000 mg | DELAYED_RELEASE_TABLET | Freq: Every day | ORAL | 1 refills | Status: AC
  Filled 2024-05-09: qty 180, 180d supply, fill #0

## 2024-05-09 NOTE — Progress Notes (Signed)
 Needs a pregnancy restriction note (heavy lifting)

## 2024-05-09 NOTE — Progress Notes (Signed)
   PRENATAL VISIT NOTE  Subjective:  Carla Sutton is a 38 y.o. G3P1011 at [redacted]w[redacted]d being seen today for ongoing prenatal care.  She is currently monitored for the following issues for this high-risk pregnancy and has Anxiety; Family history of thyroid disease; Supervision of high risk pregnancy, antepartum; Obesity in pregnancy, antepartum; and AMA (advanced maternal age) multigravida 35+ on their problem list.  Patient reports no complaints.  Contractions: Not present. Vag. Bleeding: None.  Movement: Present. Denies leaking of fluid.   The following portions of the patient's history were reviewed and updated as appropriate: allergies, current medications, past family history, past medical history, past social history, past surgical history and problem list.   Objective:    Vitals:   05/09/24 1537  BP: 116/74  Pulse: 85  Weight: 205 lb (93 kg)    Fetal Status:  Fetal Heart Rate (bpm): 154   Movement: Present    General: Alert, oriented and cooperative. Patient is in no acute distress.  Skin: Skin is warm and dry. No rash noted.   Cardiovascular: Normal heart rate noted  Respiratory: Normal respiratory effort, no problems with respiration noted  Abdomen: Soft, gravid, appropriate for gestational age.  Pain/Pressure: Absent     Pelvic: Cervical exam deferred        Extremities: Normal range of motion.  Edema: None  Mental Status: Normal mood and affect. Normal behavior. Normal judgment and thought content.   Assessment and Plan:  Pregnancy: G3P1011 at [redacted]w[redacted]d 1. [redacted] weeks gestation of pregnancy (Primary) - AFP, Serum, Open Spina Bifida  2. Supervision of high risk pregnancy, antepartum FHT normal Start ASA 81mg   3. Multigravida of advanced maternal age in second trimester  4. Obesity in pregnancy, antepartum   Preterm labor symptoms and general obstetric precautions including but not limited to vaginal bleeding, contractions, leaking of fluid and fetal movement were reviewed in  detail with the patient. Please refer to After Visit Summary for other counseling recommendations.   No follow-ups on file.  Future Appointments  Date Time Provider Department Center  06/06/2024 12:00 PM [facility] None  06/06/2024  3:30 PM Jessicaann Overbaugh J, DO [facility] None  06/26/2024  7:00 AM [facility] PROVIDER 1 [facility]  06/26/2024  7:30 AM [facility] US2 [facility]  07/08/2024  8:35 AM Eveline Lynwood MATSU, MD [facility] None    Romero Letizia J Reace Breshears, DO

## 2024-05-10 ENCOUNTER — Other Ambulatory Visit

## 2024-05-10 ENCOUNTER — Ambulatory Visit

## 2024-05-10 DIAGNOSIS — O09522 Supervision of elderly multigravida, second trimester: Secondary | ICD-10-CM

## 2024-05-11 LAB — AFP, SERUM, OPEN SPINA BIFIDA
AFP MoM: 1.96
AFP Value: 79 ng/mL
Gest. Age on Collection Date: 19 wk
Maternal Age At EDD: 37.1 a
OSBR Risk 1 IN: 881
Test Results:: NEGATIVE
Weight: 205 [lb_av]

## 2024-05-13 ENCOUNTER — Ambulatory Visit: Payer: Self-pay | Admitting: Family Medicine

## 2024-05-13 DIAGNOSIS — O0990 Supervision of high risk pregnancy, unspecified, unspecified trimester: Secondary | ICD-10-CM

## 2024-05-29 ENCOUNTER — Encounter: Payer: Self-pay | Admitting: Certified Nurse Midwife

## 2024-05-29 ENCOUNTER — Telehealth: Admitting: Physician Assistant

## 2024-05-29 DIAGNOSIS — H6993 Unspecified Eustachian tube disorder, bilateral: Secondary | ICD-10-CM

## 2024-05-29 DIAGNOSIS — R0981 Nasal congestion: Secondary | ICD-10-CM

## 2024-05-29 NOTE — Patient Instructions (Signed)
 Carla Sutton, thank you for joining Carla Velma Lunger, Carla Sutton for today's virtual visit.  While this provider is not your primary care provider (PCP), if your PCP is located in our provider database this encounter information will be shared with them immediately following your visit.   A [facility] MyChart account gives you access to today's visit and all your visits, tests, and labs performed at [facility]  click here if you don't have a [facility] MyChart account or go to [email or web address]  Consent: (Patient) Carla Sutton provided verbal consent for this virtual visit at the beginning of the encounter.  Current Medications:  Current Outpatient Medications:    aspirin  EC 81 MG tablet, Take 1 tablet (81 mg total) by mouth daily., Disp: 180 tablet, Rfl: 1   hydrOXYzine  (ATARAX ) 25 MG tablet, Take 1 tablet (25 mg total) by mouth every 6 (six) hours as needed for anxiety. (Patient not taking: Reported on 02/15/2024), Disp: 15 tablet, Rfl: 0   metroNIDAZOLE  (METROGEL ) 0.75 % vaginal gel, Place 1 Applicatorful vaginally at bedtime. Apply one applicatorful to vagina at bedtime for 5 days (Patient not taking: Reported on 05/09/2024), Disp: 70 g, Rfl: 1   Prenatal Vit-Fe Fumarate-FA (PRENATAL VITAMINS PO), Take 1 capsule by mouth daily., Disp: , Rfl:    Medications ordered in this encounter:  No orders of the defined types were placed in this encounter.    *If you need refills on other medications prior to your next appointment, please contact your pharmacy*  Follow-Up: Call back or seek an in-person evaluation if the symptoms worsen or if the condition fails to improve as anticipated.  [facility] [phone]  Other Instructions Hydrate and rest. You can start OTC Claritin-D or a plain Claritin along with Sudafed. Speak to your OB about Flonase. If you note any non-resolving, new, or worsening symptoms despite treatment, please seek an in-person evaluation  ASAP.     Common Medications Safe in Pregnancy  Acne:      Constipation:  Benzoyl Peroxide     Colace  Clindamycin      Dulcolax Suppository  Topica Erythromycin     Fibercon  Salicylic Acid      Metamucil         Miralax AVOID:        Senakot   Accutane    Cough:  Retin-A       Cough Drops  Tetracycline      Phenergan w/ Codeine if Rx  Minocycline      Robitussin (Plain & DM)  Antibiotics:     Crabs/Lice:  Ceclor       RID  Cephalosporins    AVOID:  E-Mycins      Kwell  Keflex   Macrobid /Macrodantin    Diarrhea:  Penicillin      Kao-Pectate  Zithromax       Imodium AD         PUSH FLUIDS AVOID:       Cipro     Fever:  Tetracycline      Tylenol  (Regular or Extra  Minocycline       Strength)  Levaquin      Extra Strength-Do not          Exceed 8 tabs/24 hrs Caffeine:        200mg /day (equiv. To 1 cup of coffee or  approx. 3 12 oz sodas)         Gas: Cold/Hayfever:       Gas-X  Benadryl   Mylicon  Claritin       Phazyme  **Claritin-D        Chlor-Trimeton    Headaches:  Dimetapp      ASA-Free Excedrin  Drixoral-Non-Drowsy     Cold Compress  Mucinex (Guaifenasin)     Tylenol  (Regular or Extra  Sudafed/Sudafed-12 Hour     Strength)  **Sudafed PE Pseudoephedrine   Tylenol  Cold & Sinus     Vicks Vapor Rub  Zyrtec   **AVOID if Problems With Blood Pressure         Heartburn: Avoid lying down for at least 1 hour after meals  Aciphex      Maalox     Rash:  Milk of Magnesia     Benadryl     Mylanta       1% Hydrocortisone Cream  Pepcid  Pepcid Complete   Sleep Aids:  Prevacid      Ambien    Prilosec       Benadryl   Rolaids       Chamomile Tea  Tums (Limit 4/day)     Unisom         Tylenol  PM         Warm milk-add vanilla or  Hemorrhoids:       Sugar for taste  Anusol/Anusol H.C.  (RX: Analapram 2.5%)  Sugar Substitutes:  Hydrocortisone OTC     Ok in moderation  Preparation H      Tucks        Vaseline lotion applied to tissue with  wiping    Herpes:     Throat:  Acyclovir      Oragel  Famvir  Valtrex     Vaccines:         Flu Shot Leg Cramps:       *Gardasil  Benadryl       Hepatitis A         Hepatitis B Nasal Spray:       Pneumovax  Saline Nasal Spray     Polio Booster         Tetanus Nausea:       Tuberculosis test or PPD  Vitamin B6 25 mg TID   AVOID:    Dramamine      *Gardasil  Emetrol       Live Poliovirus  Ginger Root 250 mg QID    MMR (measles, mumps &  High Complex Carbs @ Bedtime    rebella)  Sea Bands-Accupressure    Varicella (Chickenpox)  Unisom 1/2 tab TID     *No known complications           If received before Pain:         Known pregnancy;   Darvocet       Resume series after  Lortab        Delivery  Percocet    Yeast:   Tramadol      Femstat  Tylenol  3      Gyne-lotrimin  Ultram       Monistat  Vicodin           MISC:         All Sunscreens           Hair Coloring/highlights          Insect Repellant's          (Including DEET)         Mystic Tans    If you have been instructed to have an in-person evaluation today at a  local Urgent Care facility, please use the link below. It will take you to a list of all of our available [facility], including address, phone number and hours of operation. Please do not delay care.  [facility]  If you or a family member do not have a primary care provider, use the link below to schedule a visit and establish care. When you choose a [facility] [place] primary care physician or advanced practice provider, you gain a long-term partner in health. Find a Primary Care Provider  Learn more about [facility]'s in-office and virtual care options: [place] - Get Care Now

## 2024-05-29 NOTE — Progress Notes (Signed)
 Virtual Visit Consent   Carla Sutton, you are scheduled for a virtual visit with a [facility] provider today. Just as with appointments in the office, your consent must be obtained to participate. Your consent will be active for this visit and any virtual visit you may have with one of our providers in the next 365 days. If you have a MyChart account, a copy of this consent can be sent to you electronically.  As this is a virtual visit, video technology does not allow for your provider to perform a traditional examination. This may limit your provider's ability to fully assess your condition. If your provider identifies any concerns that need to be evaluated in person or the need to arrange testing (such as labs, EKG, etc.), we will make arrangements to do so. Although advances in technology are sophisticated, we cannot ensure that it will always work on either your end or our end. If the connection with a video visit is poor, the visit may have to be switched to a telephone visit. With either a video or telephone visit, we are not always able to ensure that we have a secure connection.  By engaging in this virtual visit, you consent to the provision of healthcare and authorize for your insurance to be billed (if applicable) for the services provided during this visit. Depending on your insurance coverage, you may receive a charge related to this service.  I need to obtain your verbal consent now. Are you willing to proceed with your visit today? Shifa Brisbon has provided verbal consent on 05/29/2024 for a virtual visit (video or telephone). Carla Sutton, [place]  Date: 05/29/2024 4:30 PM   Virtual Visit via Video Note   I, Carla Sutton, connected with  Kandra Graven  ([id number], [birth year]) on 05/29/24 at  4:30 PM EST by a video-enabled telemedicine application and verified that I am speaking with the correct person using two identifiers.  Location: Patient: Virtual Visit Location Patient:  Home Provider: Virtual Visit Location Provider: Home Office   I discussed the limitations of evaluation and management by telemedicine and the availability of in person appointments. The patient expressed understanding and agreed to proceed.    History of Present Illness: Carla Sutton is a 39 y.o. who identifies as a female who was assigned female at birth, and is being seen today for 1.5 d of ear pressure and buzzing sensation in the L ear. Is mostly persistent but sometimes seems to go away. Now starting in the R ear. Denies ear pain, decreased hearing or drainage from the ears. Notes some ongoing nasal congestion and ear pressure over past few weeks. Denies fever, chills. Some dizziness last week - resolved.   Currently pregnant. Not currently breastfeeding.  BP Readings from Last 3 Encounters:  05/09/24 116/74  04/09/24 123/73  03/12/24 109/68   HPI: HPI  Problems:  Patient Active Problem List   Diagnosis Date Noted   AMA (advanced maternal age) multigravida 35+ 05/01/2024   Obesity in pregnancy, antepartum 04/09/2024   Supervision of high risk pregnancy, antepartum 02/15/2024   Anxiety 08/17/2023   Family history of thyroid disease 08/17/2023    Allergies:  Allergies  Allergen Reactions   Shellfish Allergy Itching    Shrimp hands swollen and mouth gets itchy    Medications:  Current Outpatient Medications:    aspirin  EC 81 MG tablet, Take 1 tablet (81 mg total) by mouth daily., Disp: 180 tablet, Rfl: 1   Prenatal Vit-Fe Fumarate-FA (PRENATAL VITAMINS  PO), Take 1 capsule by mouth daily., Disp: , Rfl:   Observations/Objective: Patient is well-developed, well-nourished in no acute distress.  Resting comfortably  at home.  Head is normocephalic, atraumatic.  No labored breathing.  Speech is clear and coherent with logical content.  Patient is alert and oriented at baseline.   Assessment and Plan: 1. Nasal congestion (Primary)  2. Dysfunction of both eustachian  tubes  Suspect tinnitus from pressure and fluid buildup with ETD 2/2 uncontrolled allergies. Is currently pregnant -- second trimester. Not breastfeeding. Recommend hydration and rest. Can start Sudafed OTC along with Loratadine. She is to speak with her OB about use of nasal steroids during this time. If you note any non-resolving, new, or worsening symptoms despite treatment, please seek an in-person evaluation ASAP.   Follow Up Instructions: I discussed the assessment and treatment plan with the patient. The patient was provided an opportunity to ask questions and all were answered. The patient agreed with the plan and demonstrated an understanding of the instructions.  A copy of instructions were sent to the patient via MyChart unless otherwise noted below.   The patient was advised to call back or seek an in-person evaluation if the symptoms worsen or if the condition fails to improve as anticipated.    Carla Velma Lunger, PA-C

## 2024-06-06 ENCOUNTER — Ambulatory Visit (INDEPENDENT_AMBULATORY_CARE_PROVIDER_SITE_OTHER): Admitting: Family Medicine

## 2024-06-06 ENCOUNTER — Other Ambulatory Visit (HOSPITAL_COMMUNITY)

## 2024-06-06 VITALS — BP 96/65 | HR 80 | Wt 207.1 lb

## 2024-06-06 DIAGNOSIS — Z3A23 23 weeks gestation of pregnancy: Secondary | ICD-10-CM | POA: Diagnosis not present

## 2024-06-06 DIAGNOSIS — O0990 Supervision of high risk pregnancy, unspecified, unspecified trimester: Secondary | ICD-10-CM | POA: Diagnosis not present

## 2024-06-06 NOTE — Progress Notes (Signed)
   PRENATAL VISIT NOTE  Subjective:  Carla Sutton is a 38 y.o. G3P1011 at [redacted]w[redacted]d being seen today for ongoing prenatal care.  She is currently monitored for the following issues for this high-risk pregnancy and has Anxiety; Family history of thyroid disease; Supervision of high risk pregnancy, antepartum; Obesity in pregnancy, antepartum; and AMA (advanced maternal age) multigravida 35+ on their problem list.  Patient reports having loose stools - 8-10 stools a day. No changes in food, although uses a powder like crystal light to flavor water. Did have similar symptoms before.  Contractions: Not present. Vag. Bleeding: None.  Movement: Present. Denies leaking of fluid.   The following portions of the patient's history were reviewed and updated as appropriate: allergies, current medications, past family history, past medical history, past social history, past surgical history and problem list.   Objective:   Vitals:   06/06/24 1608  BP: 96/65  Pulse: 80  Weight: 207 lb 1.3 oz (93.9 kg)    Fetal Status:  Fetal Heart Rate (bpm): 143   Movement: Present    General: Alert, oriented and cooperative. Patient is in no acute distress.  Skin: Skin is warm and dry. No rash noted.   Cardiovascular: Normal heart rate noted  Respiratory: Normal respiratory effort, no problems with respiration noted  Abdomen: Soft, gravid, appropriate for gestational age.  Pain/Pressure: Absent     Pelvic: Cervical exam deferred        Extremities: Normal range of motion.  Edema: None  Mental Status: Normal mood and affect. Normal behavior. Normal judgment and thought content.      08/17/2023   11:24 AM  Depression screen PHQ 2/9  Decreased Interest 1  Down, Depressed, Hopeless 1  PHQ - 2 Score 2  Altered sleeping 1  Tired, decreased energy 1  Change in appetite 2  Feeling bad or failure about yourself  1  Trouble concentrating 1  Moving slowly or fidgety/restless 1  Suicidal thoughts 0  PHQ-9 Score 9    Difficult doing work/chores Somewhat difficult     Data saved with a previous flowsheet row definition        08/17/2023   11:24 AM  GAD 7 : Generalized Anxiety Score  Nervous, Anxious, on Edge 2  Control/stop worrying 2  Worry too much - different things 2  Trouble relaxing 2  Restless 2  Easily annoyed or irritable 1  Afraid - awful might happen 2  Total GAD 7 Score 13  Anxiety Difficulty Somewhat difficult    Assessment and Plan:  Pregnancy: G3P1011 at [redacted]w[redacted]d 1. [redacted] weeks gestation of pregnancy (Primary)  2. Supervision of high risk pregnancy, antepartum FHT normal She will send picture of flavoring packet via mychart. May have sweetner that causes laxative effect.  Preterm labor symptoms and general obstetric precautions including but not limited to vaginal bleeding, contractions, leaking of fluid and fetal movement were reviewed in detail with the patient. Please refer to After Visit Summary for other counseling recommendations.   No follow-ups on file.  Future Appointments  Date Time Provider Department Center  06/26/2024  7:00 AM [facility] PROVIDER 1 [facility]  06/26/2024  7:30 AM [facility] US2 [facility]  07/08/2024  8:35 AM Eveline Lynwood MATSU, MD [facility] None  08/16/2024 12:00 PM [facility] None    Lang JINNY Peel, DO

## 2024-06-26 ENCOUNTER — Ambulatory Visit

## 2024-06-26 ENCOUNTER — Ambulatory Visit: Attending: Obstetrics and Gynecology | Admitting: Maternal & Fetal Medicine

## 2024-06-26 VITALS — BP 112/68 | HR 87

## 2024-06-26 DIAGNOSIS — O0992 Supervision of high risk pregnancy, unspecified, second trimester: Secondary | ICD-10-CM | POA: Insufficient documentation

## 2024-06-26 DIAGNOSIS — O99212 Obesity complicating pregnancy, second trimester: Secondary | ICD-10-CM | POA: Insufficient documentation

## 2024-06-26 DIAGNOSIS — Z363 Encounter for antenatal screening for malformations: Secondary | ICD-10-CM | POA: Insufficient documentation

## 2024-06-26 DIAGNOSIS — O09522 Supervision of elderly multigravida, second trimester: Secondary | ICD-10-CM | POA: Diagnosis not present

## 2024-06-26 DIAGNOSIS — O99210 Obesity complicating pregnancy, unspecified trimester: Secondary | ICD-10-CM

## 2024-06-26 DIAGNOSIS — Z3A26 26 weeks gestation of pregnancy: Secondary | ICD-10-CM | POA: Insufficient documentation

## 2024-06-26 NOTE — Progress Notes (Signed)
 Patient information  Patient Name: Carla Sutton  Patient MRN:   [id number]  Referring practice: [facility] Referring Provider: [facility] [place] [facility] ([facility])  Problem List   Patient Active Problem List   Diagnosis Date Noted   AMA (advanced maternal age) multigravida 35+ 05/01/2024   Obesity in pregnancy, antepartum 04/09/2024   Supervision of high risk pregnancy, antepartum 02/15/2024   Anxiety 08/17/2023   Family history of thyroid disease 08/17/2023   Maternal Fetal Medicine Consult [facility] Carla Sutton is a 38 y.o. G3P1011 at [redacted]w[redacted]d here for ultrasound and consultation. She had low risk aneuploidy screening of a female fetus. Carrier screening was Negative for the basic screening (SMA, alpha-thal, beta-thal, and cystic fibroisis. Maternal serum AFP was negative. She has no acute concerns.   Today we focused on the following:   Pregnancy is complicated by advanced maternal age of 38 years and elevated BMI of 37, both of which increase the risk of gestational diabetes, hypertensive disorders, fetal growth abnormalities, stillbirth, and cesarean delivery. Obstetric history is notable for a prior spontaneous abortion in the first pregnancy and a second pregnancy complicated by a prolonged induction with subsequent uncomplicated vaginal delivery. The importance of appropriate gestational weight gain, nutrition, and regular physical activity was reviewed. Risks related to placental function and fetal well-being were discussed, along with the role of serial growth assessment and antenatal surveillance later in pregnancy. Patient reports no current concerns and endorses good fetal movement.  Recommendations: -Early glucose screening at 24-28 weeks -Baseline preeclampsia labs -Aspirin  81 mg daily as indicated -Nutrition and exercise counseling with recommended weight gain targets -Serial fetal growth ultrasounds in the third trimester -Strict fetal movement precautions -Delivery planning at 39-40 weeks or  earlier if clinically indicated  60 minutes of time was spent reviewing the patient's chart including labs, imaging and documentation.  At least 50% of this time was spent with direct patient care discussing the diagnosis, management and prognosis of her care.  Review of Systems: A review of systems was performed and was negative except per HPI   Past Obstetrical History:  OB History  Gravida Para Term Preterm AB Living  3 1 1  0 1 1  SAB IAB Ectopic Multiple Live Births  1 0 0 0 1    # Outcome Date GA Lbr Len/2nd Weight Sex Type Anes PTL Lv  3 Current           2 Term 09/03/14 [redacted]w[redacted]d 03:36 / 03:25 6 lb 7.2 oz (2.925 kg) M Vag-Spont None  LIV     Birth Comments: Hgb, Normal, FA Newborn Screen Barcode: [id number] Date Collected: [id number]   1 SAB 2013             Past Medical History:  Past Medical History:  Diagnosis Date   Abnormal menstrual cycle 08/17/2023   Infection    UTI     Past Surgical History:    Past Surgical History:  Procedure Laterality Date   DILATION AND CURETTAGE OF UTERUS       Home Medications:   Current Outpatient Medications on File Prior to Visit  Medication Sig Dispense Refill   Prenatal Vit-Fe Fumarate-FA (PRENATAL VITAMINS PO) Take 1 capsule by mouth daily.     aspirin  EC 81 MG tablet Take 1 tablet (81 mg total) by mouth daily. (Patient not taking: Reported on 06/26/2024) 180 tablet 1   No current facility-administered medications on file prior to visit.      Allergies:   Allergies  Allergen Reactions  Shellfish Allergy Itching    Shrimp hands swollen and mouth gets itchy      Physical Exam:   Vitals:   06/26/24 0709  BP: 112/68  Pulse: 87   Sitting comfortably on the sonogram table Nonlabored breathing Normal rate and rhythm Abdomen is nontender  Thank you for the opportunity to be involved with this patient's care. Please let us  know if we can be of any further assistance.   Delora Smaller MFM, [place]   06/26/2024   8:20 AM

## 2024-06-27 ENCOUNTER — Telehealth: Admitting: Physician Assistant

## 2024-06-27 DIAGNOSIS — O2340 Unspecified infection of urinary tract in pregnancy, unspecified trimester: Secondary | ICD-10-CM | POA: Diagnosis not present

## 2024-06-27 DIAGNOSIS — Z3A26 26 weeks gestation of pregnancy: Secondary | ICD-10-CM

## 2024-06-27 NOTE — Progress Notes (Signed)
 Virtual Visit Consent   Carla Sutton, you are scheduled for a virtual visit with a [facility] provider today. Just as with appointments in the office, your consent must be obtained to participate. Your consent will be active for this visit and any virtual visit you may have with one of our providers in the next 365 days. If you have a MyChart account, a copy of this consent can be sent to you electronically.  As this is a virtual visit, video technology does not allow for your provider to perform a traditional examination. This may limit your provider's ability to fully assess your condition. If your provider identifies any concerns that need to be evaluated in person or the need to arrange testing (such as labs, EKG, etc.), we will make arrangements to do so. Although advances in technology are sophisticated, we cannot ensure that it will always work on either your end or our end. If the connection with a video visit is poor, the visit may have to be switched to a telephone visit. With either a video or telephone visit, we are not always able to ensure that we have a secure connection.  By engaging in this virtual visit, you consent to the provision of healthcare and authorize for your insurance to be billed (if applicable) for the services provided during this visit. Depending on your insurance coverage, you may receive a charge related to this service.  I need to obtain your verbal consent now. Are you willing to proceed with your visit today? Carla Sutton has provided verbal consent on 06/27/2024 for a virtual visit (video or telephone). [name], [place]  Date: 06/27/2024 5:41 PM   Virtual Visit via Video Note   I, [name], connected with  Carla Sutton  ([id number], 1986-03-01) on 06/27/2024 at  5:45 PM EST by a video-enabled telemedicine application and verified that I am speaking with the correct person using two identifiers.  Location: Patient: Virtual Visit Location Patient:  Home Provider: Virtual Visit Location Provider: Home Office   I discussed the limitations of evaluation and management by telemedicine and the availability of in person appointments. The patient expressed understanding and agreed to proceed.    History of Present Illness: Carla Sutton is a 38 y.o. who identifies as a female who was assigned female at birth, and is being seen today for urinary symptoms. Notes she woke up this AM, noted some dysuria. Slight lingering before resolved. Some frequency but has been staying well-hydrated.Denies fever, chills. Denies nausea/vomiting. Denies back or belly pain.  Is currently [redacted] weeks pregnant.  HPI: HPI  Problems:  Patient Active Problem List   Diagnosis Date Noted   AMA (advanced maternal age) multigravida 35+ 05/01/2024   Obesity in pregnancy, antepartum 04/09/2024   Supervision of high risk pregnancy, antepartum 02/15/2024   Anxiety 08/17/2023   Family history of thyroid disease 08/17/2023    Allergies: Allergies[1] Medications: Current Medications[2]  Observations/Objective: Patient is well-developed, well-nourished in no acute distress.  Resting comfortably at home.  Head is normocephalic, atraumatic.  No labored breathing. Speech is clear and coherent with logical content.  Patient is alert and oriented at baseline.   Assessment and Plan: 1. Urinary tract infection in mother during pregnancy, antepartum (Primary)  Increase fluids. Encouraged to not hold bladder to hopefully flush this out. If not resolving with increased hydration, needs follow-up with her OB as she would require exam and urine culturing prior to initiation of antibiotics giving her pregnancy status.  Follow  Up Instructions: I discussed the assessment and treatment plan with the patient. The patient was provided an opportunity to ask questions and all were answered. The patient agreed with the plan and demonstrated an understanding of the instructions.  A copy of  instructions were sent to the patient via MyChart unless otherwise noted below.   The patient was advised to call back or seek an in-person evaluation if the symptoms worsen or if the condition fails to improve as anticipated.    [name], PA-C    [1]  Allergies Allergen Reactions   Shellfish Allergy Itching    Shrimp hands swollen and mouth gets itchy   [2]  Current Outpatient Medications:    aspirin  EC 81 MG tablet, Take 1 tablet (81 mg total) by mouth daily. (Patient not taking: Reported on 06/26/2024), Disp: 180 tablet, Rfl: 1   Prenatal Vit-Fe Fumarate-FA (PRENATAL VITAMINS PO), Take 1 capsule by mouth daily., Disp: , Rfl:

## 2024-06-27 NOTE — Patient Instructions (Signed)
°  Carla Sutton, thank you for joining Carla Velma Lunger, Carla Sutton for today's virtual visit.  While this provider is not your primary care provider (PCP), if your PCP is located in our provider database this encounter information will be shared with them immediately following your visit.   A [facility] MyChart account gives you access to today's visit and all your visits, tests, and labs performed at [facility]  click here if you don't have a [facility] MyChart account or go to [email or web address]  Consent: (Patient) Carla Sutton provided verbal consent for this virtual visit at the beginning of the encounter.  Current Medications:  Current Outpatient Medications:    aspirin  EC 81 MG tablet, Take 1 tablet (81 mg total) by mouth daily. (Patient not taking: Reported on 06/26/2024), Disp: 180 tablet, Rfl: 1   Prenatal Vit-Fe Fumarate-FA (PRENATAL VITAMINS PO), Take 1 capsule by mouth daily., Disp: , Rfl:    Medications ordered in this encounter:  No orders of the defined types were placed in this encounter.    *If you need refills on other medications prior to your next appointment, please contact your pharmacy*  Follow-Up: Call back or seek an in-person evaluation if the symptoms worsen or if the condition fails to improve as anticipated.  [facility] [phone]  Other Instructions Increase fluids and rest. Avoid caffeine or sugary beverages. If not improving overnight, you need to call your OB office for evaluation giving current pregnancy.   If you have been instructed to have an in-person evaluation today at a local Urgent Care facility, please use the link below. It will take you to a list of all of our available [facility], including address, phone number and hours of operation. Please do not delay care.  [facility]  If you or a family member do not have a primary care provider, use the link below to schedule a visit and  establish care. When you choose a [facility] primary care physician or advanced practice provider, you gain a long-term partner in health. Find a Primary Care Provider  Learn more about [facility]'s in-office and virtual care options: [facility] - Get Care Now

## 2024-06-28 ENCOUNTER — Other Ambulatory Visit: Payer: Self-pay

## 2024-06-28 ENCOUNTER — Telehealth: Payer: Self-pay

## 2024-06-28 ENCOUNTER — Ambulatory Visit
Admission: EM | Admit: 2024-06-28 | Discharge: 2024-06-28 | Disposition: A | Discharge status: Home / Self Care | Attending: Family Medicine | Admitting: Family Medicine

## 2024-06-28 DIAGNOSIS — R300 Dysuria: Secondary | ICD-10-CM | POA: Diagnosis not present

## 2024-06-28 DIAGNOSIS — N3001 Acute cystitis with hematuria: Secondary | ICD-10-CM | POA: Diagnosis not present

## 2024-06-28 LAB — POCT URINE DIPSTICK
Bilirubin, UA: NEGATIVE
Glucose, UA: NEGATIVE mg/dL
Ketones, POC UA: NEGATIVE mg/dL
Nitrite, UA: NEGATIVE
POC PROTEIN,UA: NEGATIVE
Spec Grav, UA: 1.02 (ref 1.010–1.025)
Urobilinogen, UA: 0.2 U/dL
pH, UA: 7 (ref 5.0–8.0)

## 2024-06-28 MED ORDER — CEFDINIR 300 MG PO CAPS: 300.0000 mg | ORAL_CAPSULE | Freq: Two times a day (BID) | ORAL | 0 refills | Status: AC

## 2024-06-28 NOTE — ED Provider Notes (Signed)
 [facility]    CSN: [id number] Arrival date & time: 06/28/24  9163      History   Chief Complaint Chief Complaint  Patient presents with   Dysuria   Urinary Frequency    HPI Carla Sutton is a 38 y.o. female.   HPI 38 year old female presents with concern of UTI.  Patient reports that she is currently pregnant at [redacted] weeks and 2 days with an estimated due date of 10/02/2024.  PMH significant for obesity, anxiety and frequent UTI.  Past Medical History:  Diagnosis Date   Abnormal menstrual cycle 08/17/2023   Infection    UTI    Patient Active Problem List   Diagnosis Date Noted   AMA (advanced maternal age) multigravida 35+ 05/01/2024   Obesity in pregnancy, antepartum 04/09/2024   Supervision of high risk pregnancy, antepartum 02/15/2024   Anxiety 08/17/2023   Family history of thyroid disease 08/17/2023    Past Surgical History:  Procedure Laterality Date   DILATION AND CURETTAGE OF UTERUS      OB History     Gravida  3   Para  1   Term  1   Preterm  0   AB  1   Living  1      SAB  1   IAB  0   Ectopic  0   Multiple  0   Live Births  1            Home Medications    Prior to Admission medications  Medication Sig Start Date End Date Taking? Authorizing Provider  cefdinir (OMNICEF) 300 MG capsule Take 1 capsule (300 mg total) by mouth 2 (two) times daily for 7 days. 06/28/24 07/05/24 Yes Teddy Sharper, FNP  aspirin  EC 81 MG tablet Take 1 tablet (81 mg total) by mouth daily. Patient not taking: Reported on 06/26/2024 05/09/24   Stinson, Jacob J, DO  Prenatal Vit-Fe Fumarate-FA (PRENATAL VITAMINS PO) Take 1 capsule by mouth daily.    [provider]    Family History Family History  Problem Relation Age of Onset   Cancer Mother        cervical cancer   Diabetes Mother    Heart disease Mother    Hyperlipidemia Mother    Hypertension Mother    Thyroid disease Mother    Hyperlipidemia Father    Hypertension  Father    Diabetes Father     Social History Social History[1]   Allergies   Shellfish allergy   Review of Systems Review of Systems   Physical Exam Triage Vital Signs ED Triage Vitals  Encounter Vitals Group     BP      Girls Systolic BP Percentile      Girls Diastolic BP Percentile      Boys Systolic BP Percentile      Boys Diastolic BP Percentile      Pulse      Resp      Temp      Temp src      SpO2      Weight      Height      Head Circumference      Peak Flow      Pain Score      Pain Loc      Pain Education      Exclude from Growth Chart    No data found.  Updated Vital Signs BP 120/72 (BP Location: Right Arm)   Pulse 88  Temp 98.1 F (36.7 C) (Oral)   Resp 18   Ht 5' 2 (1.575 m)   Wt 209 lb (94.8 kg)   LMP 12/10/2023   SpO2 98%   BMI 38.23 kg/m   Visual Acuity Right Eye Distance:   Left Eye Distance:   Bilateral Distance:    Right Eye Near:   Left Eye Near:    Bilateral Near:     Physical Exam Vitals and nursing note reviewed.  Constitutional:      General: She is not in acute distress.    Appearance: Normal appearance. She is obese. She is not ill-appearing.  HENT:     Head: Normocephalic and atraumatic.     Mouth/Throat:     Mouth: Mucous membranes are moist.     Pharynx: Oropharynx is clear.  Eyes:     Extraocular Movements: Extraocular movements intact.     Conjunctiva/sclera: Conjunctivae normal.     Pupils: Pupils are equal, round, and reactive to light.  Cardiovascular:     Rate and Rhythm: Normal rate and regular rhythm.     Heart sounds: Normal heart sounds.  Pulmonary:     Effort: Pulmonary effort is normal.     Breath sounds: Normal breath sounds. No wheezing, rhonchi or rales.  Abdominal:     Tenderness: There is no right CVA tenderness or left CVA tenderness.  Musculoskeletal:        General: Normal range of motion.  Skin:    General: Skin is warm and dry.  Neurological:     General: No focal deficit  present.     Mental Status: She is alert and oriented to person, place, and time. Mental status is at baseline.  Psychiatric:        Mood and Affect: Mood normal.        Behavior: Behavior normal.      UC Treatments / Results  Labs (all labs ordered are listed, but only abnormal results are displayed) Labs Reviewed  POCT URINE DIPSTICK - Abnormal; Notable for the following components:      Result Value   Clarity, UA cloudy (*)    Blood, UA trace-intact (*)    Leukocytes, UA Large (3+) (*)    All other components within normal limits  URINE CULTURE    EKG   Radiology No results found.  Procedures Procedures (including critical care time)  Medications Ordered in UC Medications - No data to display  Initial Impression / Assessment and Plan / UC Course  I have reviewed the triage vital signs and the nursing notes.  Pertinent labs & imaging results that were available during my care of the patient were reviewed by me and considered in my medical decision making (see chart for details).     MDM: 1.  Acute cystitis with hematuria-Rx'd cefdinir 300 mg capsule: Take 1 capsule twice daily x 7 days; 2.  Dysuria-UA revealed above, urine culture ordered. Advised patient take medication as directed with food to completion encouraged to increase daily water intake to 64 ounces per day while taking this medication.  Advised if symptoms worsen and/or unresolved please follow-up with your OB, PCP, or here for further evaluation.  Patient discharged home, hemodynamically stable. Final Clinical Impressions(s) / UC Diagnoses   Final diagnoses:  Acute cystitis with hematuria  Dysuria     Discharge Instructions      Advised patient take medication as directed with food to completion encouraged to increase daily water intake to 64 ounces per  day while taking this medication.  Advised if symptoms worsen and/or unresolved please follow-up with your OB, PCP, or here for further  evaluation.     ED Prescriptions     Medication Sig Dispense Auth. Provider   cefdinir (OMNICEF) 300 MG capsule Take 1 capsule (300 mg total) by mouth 2 (two) times daily for 7 days. 14 capsule Verlean Allport, FNP      PDMP not reviewed this encounter.    [1]  Social History Tobacco Use   Smoking status: Never   Smokeless tobacco: Never  Vaping Use   Vaping status: Never Used  Substance Use Topics   Alcohol use: No    Alcohol/week: 5.0 standard drinks of alcohol    Types: 5 Standard drinks or equivalent per week   Drug use: No     Teddy Sharper, FNP 06/28/24 0920

## 2024-06-28 NOTE — Telephone Encounter (Signed)
 Patient called in and said she did a virtual visit yesterday for a UTI. They wanted her to come in to be tested. Informed patient that we are short a nurse today and our schedule is full. Let her know that her PCP office can test her, as well as an urgent care. Patient verbalized understanding.

## 2024-06-28 NOTE — Discharge Instructions (Addendum)
 Advised patient take medication as directed with food to completion encouraged to increase daily water intake to 64 ounces per day while taking this medication.  Advised if symptoms worsen and/or unresolved please follow-up with your OB, PCP, or here for further evaluation.

## 2024-06-28 NOTE — ED Triage Notes (Signed)
 Pt presents with a chief complaint of dysuria. Symptoms began yesterday. Experiencing burning with urination and urinary frequency. Rates pain with urination a 6/10. Has been trying to increase her fluids. Pt mentions she is six months pregnant. Only taking her prenatals currently.

## 2024-06-29 LAB — URINE CULTURE: Culture: <10000 — AB

## 2024-07-01 ENCOUNTER — Ambulatory Visit (HOSPITAL_COMMUNITY): Payer: Self-pay

## 2024-07-08 ENCOUNTER — Ambulatory Visit: Admitting: Obstetrics & Gynecology

## 2024-07-08 VITALS — BP 110/67 | HR 90 | Wt 213.0 lb

## 2024-07-08 DIAGNOSIS — O09522 Supervision of elderly multigravida, second trimester: Secondary | ICD-10-CM | POA: Diagnosis not present

## 2024-07-08 DIAGNOSIS — O99212 Obesity complicating pregnancy, second trimester: Secondary | ICD-10-CM

## 2024-07-08 DIAGNOSIS — O99210 Obesity complicating pregnancy, unspecified trimester: Secondary | ICD-10-CM

## 2024-07-08 DIAGNOSIS — Z3A27 27 weeks gestation of pregnancy: Secondary | ICD-10-CM

## 2024-07-08 DIAGNOSIS — O0990 Supervision of high risk pregnancy, unspecified, unspecified trimester: Secondary | ICD-10-CM

## 2024-07-08 NOTE — Progress Notes (Signed)
 "  PRENATAL VISIT NOTE  Subjective:  Carla Sutton is a 38 y.o. G3P1011 at [redacted]w[redacted]d being seen today for ongoing prenatal care.  She is currently monitored for the following issues for this high-risk pregnancy and has Anxiety; Family history of thyroid disease; Supervision of high risk pregnancy, antepartum; Obesity in pregnancy, antepartum; and AMA (advanced maternal age) multigravida 35+ on their problem list.  Patient reports occasional contractions.  Contractions: Not present. Vag. Bleeding: None.  Movement: Present. Denies leaking of fluid.   The following portions of the patient's history were reviewed and updated as appropriate: allergies, current medications, past family history, past medical history, past social history, past surgical history and problem list.   Objective:   Vitals:   07/08/24 0905  BP: 110/67  Pulse: 90  Weight: 213 lb (96.6 kg)    Fetal Status:  Fetal Heart Rate (bpm): 133   Movement: Present    General: Alert, oriented and cooperative. Patient is in no acute distress.  Skin: Skin is warm and dry. No rash noted.   Cardiovascular: Normal heart rate noted  Respiratory: Normal respiratory effort, no problems with respiration noted  Abdomen: Soft, gravid, appropriate for gestational age.  Pain/Pressure: Present     Pelvic: Cervical exam deferred        Extremities: Normal range of motion.  Edema: None  Mental Status: Normal mood and affect. Normal behavior. Normal judgment and thought content.      08/17/2023   11:24 AM  Depression screen PHQ 2/9  Decreased Interest 1  Down, Depressed, Hopeless 1  PHQ - 2 Score 2  Altered sleeping 1  Tired, decreased energy 1  Change in appetite 2  Feeling bad or failure about yourself  1  Trouble concentrating 1  Moving slowly or fidgety/restless 1  Suicidal thoughts 0  PHQ-9 Score 9   Difficult doing work/chores Somewhat difficult     Data saved with a previous flowsheet row definition        08/17/2023   11:24 AM   GAD 7 : Generalized Anxiety Score  Nervous, Anxious, on Edge 2  Control/stop worrying 2  Worry too much - different things 2  Trouble relaxing 2  Restless 2  Easily annoyed or irritable 1  Afraid - awful might happen 2  Total GAD 7 Score 13  Anxiety Difficulty Somewhat difficult    Assessment and Plan:  Pregnancy: G3P1011 at [redacted]w[redacted]d 1. [redacted] weeks gestation of pregnancy (Primary)  - CBC - Glucose Tolerance, 2 Hours w/1 Hour - HIV Antibody (routine testing w rflx) - RPR W/RFLX TO RPR TITER, TREPONEMAL AB, SCREEN AND DIAGNOSIS  2. Supervision of high risk pregnancy, antepartum  - CBC - Glucose Tolerance, 2 Hours w/1 Hour - HIV Antibody (routine testing w rflx) - RPR W/RFLX TO RPR TITER, TREPONEMAL AB, SCREEN AND DIAGNOSIS  3. Multigravida of advanced maternal age in second trimester [redacted]w[redacted]d   4. Obesity in pregnancy, antepartum Body mass index is 38.96 kg/m.   Preterm labor symptoms and general obstetric precautions including but not limited to vaginal bleeding, contractions, leaking of fluid and fetal movement were reviewed in detail with the patient. Please refer to After Visit Summary for other counseling recommendations.   No follow-ups on file.  Future Appointments  Date Time Provider Department Center  07/25/2024  7:15 AM [facility] PROVIDER 1 [facility]  07/25/2024  7:30 AM [facility] US5 [facility]  08/01/2024  8:55 AM Barbra Lang PARAS, DO [facility] None  08/12/2024 11:15 AM Abigail, Rollo  T, MD [facility] None  08/16/2024 12:00 PM [facility] None  08/26/2024 10:35 AM Anyanwu, Gloris LABOR, MD [facility] None  09/09/2024 10:35 AM Abigail, Rollo DASEN, MD [facility] None  09/16/2024 10:35 AM Dunn, Rollo DASEN, MD [facility] None    Lynwood Solomons, MD "

## 2024-07-09 LAB — CBC
Hematocrit: 33.6 % — ABNORMAL LOW (ref 34.0–46.6)
Hemoglobin: 11.1 g/dL (ref 11.1–15.9)
MCH: 30.7 pg (ref 26.6–33.0)
MCHC: 33 g/dL (ref 31.5–35.7)
MCV: 93 fL (ref 79–97)
Platelets: 268 x10E3/uL (ref 150–450)
RBC: 3.62 x10E6/uL — ABNORMAL LOW (ref 3.77–5.28)
RDW: 12.7 % (ref 11.7–15.4)
WBC: 8.5 x10E3/uL (ref 3.4–10.8)

## 2024-07-09 LAB — SYPHILIS: RPR W/REFLEX TO RPR TITER AND TREPONEMAL ANTIBODIES, TRADITIONAL SCREENING AND DIAGNOSIS ALGORITHM: RPR Ser Ql: NONREACTIVE

## 2024-07-09 LAB — GLUCOSE TOLERANCE, 2 HOURS W/ 1HR
Glucose, 1 hour: 113 mg/dL (ref 70–179)
Glucose, 2 hour: 106 mg/dL (ref 70–152)
Glucose, Fasting: 78 mg/dL (ref 70–91)

## 2024-07-09 LAB — HIV ANTIBODY (ROUTINE TESTING W REFLEX): HIV Screen 4th Generation wRfx: NONREACTIVE

## 2024-07-25 ENCOUNTER — Ambulatory Visit

## 2024-07-25 ENCOUNTER — Encounter: Payer: Self-pay | Admitting: Family Medicine

## 2024-08-01 ENCOUNTER — Other Ambulatory Visit (HOSPITAL_COMMUNITY)
Admission: RE | Admit: 2024-08-01 | Discharge: 2024-08-01 | Disposition: A | Discharge status: Home / Self Care | Source: Ambulatory Visit | Attending: Family Medicine | Admitting: Family Medicine

## 2024-08-01 ENCOUNTER — Ambulatory Visit: Admitting: Family Medicine

## 2024-08-01 VITALS — BP 111/75 | HR 93 | Wt 212.1 lb

## 2024-08-01 DIAGNOSIS — N898 Other specified noninflammatory disorders of vagina: Secondary | ICD-10-CM | POA: Insufficient documentation

## 2024-08-01 DIAGNOSIS — R102 Pelvic and perineal pain unspecified side: Secondary | ICD-10-CM | POA: Insufficient documentation

## 2024-08-01 DIAGNOSIS — Z3A31 31 weeks gestation of pregnancy: Secondary | ICD-10-CM | POA: Diagnosis not present

## 2024-08-01 DIAGNOSIS — Z8349 Family history of other endocrine, nutritional and metabolic diseases: Secondary | ICD-10-CM

## 2024-08-01 DIAGNOSIS — O09523 Supervision of elderly multigravida, third trimester: Secondary | ICD-10-CM

## 2024-08-01 DIAGNOSIS — O0990 Supervision of high risk pregnancy, unspecified, unspecified trimester: Secondary | ICD-10-CM | POA: Diagnosis not present

## 2024-08-01 NOTE — Progress Notes (Signed)
 "  PRENATAL VISIT NOTE  Subjective:  Carla Sutton is a 39 y.o. G3P1011 at [redacted]w[redacted]d being seen today for ongoing prenatal care.  She is currently monitored for the following issues for this high-risk pregnancy and has Anxiety; Family history of thyroid disease; Supervision of high risk pregnancy, antepartum; Obesity in pregnancy, antepartum; and AMA (advanced maternal age) multigravida 35+ on their problem list.  Patient reports no complaints.   . Vag. Bleeding: None.  Movement: Present. Denies leaking of fluid.   The following portions of the patient's history were reviewed and updated as appropriate: allergies, current medications, past family history, past medical history, past social history, past surgical history and problem list.   Objective:   Vitals:   08/01/24 0911  BP: 111/75  Pulse: 93  Weight: 212 lb 1.9 oz (96.2 kg)    Fetal Status:  Fetal Heart Rate (bpm): 146   Movement: Present    General: Alert, oriented and cooperative. Patient is in no acute distress.  Skin: Skin is warm and dry. No rash noted.   Cardiovascular: Normal heart rate noted  Respiratory: Normal respiratory effort, no problems with respiration noted  Abdomen: Soft, gravid, appropriate for gestational age.  Pain/Pressure: Present     Pelvic: Cervical exam deferred        Extremities: Normal range of motion.  Edema: None  Mental Status: Normal mood and affect. Normal behavior. Normal judgment and thought content.      07/08/2024    9:37 AM 08/17/2023   11:24 AM  Depression screen PHQ 2/9  Decreased Interest 1 1  Down, Depressed, Hopeless 1 1  PHQ - 2 Score 2 2  Altered sleeping 1 1  Tired, decreased energy 1 1  Change in appetite 1 2  Feeling bad or failure about yourself  0 1  Trouble concentrating 1 1  Moving slowly or fidgety/restless 1 1  Suicidal thoughts 0 0  PHQ-9 Score 7 9   Difficult doing work/chores  Somewhat difficult     Data saved with a previous flowsheet row definition         07/08/2024    9:38 AM 08/17/2023   11:24 AM  GAD 7 : Generalized Anxiety Score  Nervous, Anxious, on Edge 1 2  Control/stop worrying 1 2  Worry too much - different things 1 2  Trouble relaxing 1 2  Restless 1 2  Easily annoyed or irritable 1 1  Afraid - awful might happen 1 2  Total GAD 7 Score 7 13  Anxiety Difficulty  Somewhat difficult    Assessment and Plan:  Pregnancy: G3P1011 at [redacted]w[redacted]d 1. [redacted] weeks gestation of pregnancy (Primary)  2. Supervision of high risk pregnancy, antepartum FHT normal Was sick last week. Starting to feel better  3. Family history of thyroid disease  4. Multigravida of advanced maternal age in third trimester ASA 81mg   5. Vaginal pain - Cervicovaginal ancillary only( [place])  6. Vaginal discharge - Cervicovaginal ancillary only( [place])  Preterm labor symptoms and general obstetric precautions including but not limited to vaginal bleeding, contractions, leaking of fluid and fetal movement were reviewed in detail with the patient. Please refer to After Visit Summary for other counseling recommendations.   No follow-ups on file.  Future Appointments  Date Time Provider Department Center  08/12/2024 11:15 AM Abigail Rollo DASEN, MD [facility] None  08/16/2024 12:00 PM [facility] None  08/26/2024 10:35 AM Anyanwu, Gloris LABOR, MD [facility] None  09/09/2024 10:35 AM Abigail Rollo  T, MD [facility] None  09/16/2024 10:35 AM Dunn, Rollo DASEN, MD [facility] None    Reyaan Thoma J Skylyn Slezak, DO "

## 2024-08-02 LAB — CERVICOVAGINAL ANCILLARY ONLY
Bacterial Vaginitis (gardnerella): POSITIVE — AB
Candida Glabrata: NEGATIVE
Candida Vaginitis: POSITIVE — AB
Comment: NEGATIVE
Comment: NEGATIVE
Comment: NEGATIVE

## 2024-08-05 ENCOUNTER — Ambulatory Visit: Payer: Self-pay | Admitting: Family Medicine

## 2024-08-05 MED ORDER — METRONIDAZOLE 500 MG PO TABS: 500.0000 mg | ORAL_TABLET | Freq: Two times a day (BID) | ORAL | 0 refills | Status: AC

## 2024-08-05 MED ORDER — FLUCONAZOLE 150 MG PO TABS: 150.0000 mg | ORAL_TABLET | Freq: Once | ORAL | 0 refills | Status: AC

## 2024-08-12 ENCOUNTER — Encounter: Admitting: Obstetrics and Gynecology

## 2024-08-16 ENCOUNTER — Encounter

## 2024-08-16 ENCOUNTER — Other Ambulatory Visit (HOSPITAL_COMMUNITY)

## 2024-08-26 ENCOUNTER — Encounter: Admitting: Obstetrics & Gynecology

## 2024-09-09 ENCOUNTER — Encounter: Admitting: Obstetrics and Gynecology

## 2024-09-16 ENCOUNTER — Encounter: Admitting: Obstetrics and Gynecology
